# Patient Record
Sex: Male | Born: 1955 | Race: White | Hispanic: No | Marital: Married | Smoking: Never smoker
Health system: Southern US, Community
[De-identification: ages and names within clinical notes are randomized; demographics above are authoritative.]

## PROBLEM LIST (undated history)

## (undated) DIAGNOSIS — N4340 Spermatocele of epididymis, unspecified: Secondary | ICD-10-CM

## (undated) DIAGNOSIS — Z8719 Personal history of other diseases of the digestive system: Secondary | ICD-10-CM

## (undated) DIAGNOSIS — K219 Gastro-esophageal reflux disease without esophagitis: Secondary | ICD-10-CM

## (undated) DIAGNOSIS — R972 Elevated prostate specific antigen [PSA]: Secondary | ICD-10-CM

## (undated) DIAGNOSIS — J302 Other seasonal allergic rhinitis: Secondary | ICD-10-CM

## (undated) DIAGNOSIS — K649 Unspecified hemorrhoids: Secondary | ICD-10-CM

## (undated) DIAGNOSIS — M1990 Unspecified osteoarthritis, unspecified site: Secondary | ICD-10-CM

## (undated) HISTORY — DX: Other seasonal allergic rhinitis: J30.2

## (undated) HISTORY — DX: Unspecified osteoarthritis, unspecified site: M19.90

## (undated) HISTORY — PX: TONSILLECTOMY: SUR1361

---

## 2012-01-02 HISTORY — PX: COLONOSCOPY WITH PROPOFOL: SHX5780

## 2012-04-10 ENCOUNTER — Encounter: Payer: Self-pay | Admitting: Internal Medicine

## 2012-04-10 ENCOUNTER — Ambulatory Visit (INDEPENDENT_AMBULATORY_CARE_PROVIDER_SITE_OTHER): Payer: No Typology Code available for payment source | Admitting: Internal Medicine

## 2012-04-10 VITALS — BP 126/84 | HR 64 | Temp 98.2°F | Ht 74.0 in | Wt 202.0 lb

## 2012-04-10 DIAGNOSIS — Z0000 Encounter for general adult medical examination without abnormal findings: Secondary | ICD-10-CM | POA: Insufficient documentation

## 2012-04-10 DIAGNOSIS — R5381 Other malaise: Secondary | ICD-10-CM

## 2012-04-10 DIAGNOSIS — Z1211 Encounter for screening for malignant neoplasm of colon: Secondary | ICD-10-CM

## 2012-04-10 DIAGNOSIS — R5383 Other fatigue: Secondary | ICD-10-CM | POA: Insufficient documentation

## 2012-04-10 DIAGNOSIS — Z125 Encounter for screening for malignant neoplasm of prostate: Secondary | ICD-10-CM

## 2012-04-10 DIAGNOSIS — H9313 Tinnitus, bilateral: Secondary | ICD-10-CM

## 2012-04-10 DIAGNOSIS — H9319 Tinnitus, unspecified ear: Secondary | ICD-10-CM

## 2012-04-10 DIAGNOSIS — M549 Dorsalgia, unspecified: Secondary | ICD-10-CM | POA: Insufficient documentation

## 2012-04-10 DIAGNOSIS — Z13220 Encounter for screening for lipoid disorders: Secondary | ICD-10-CM

## 2012-04-10 NOTE — Patient Instructions (Addendum)
Return for fasting labs .  Decide who's doing your prostate exam.   Colonoscopy to be set u  Protect your hearing when you use the blower.  If the tinnitus does not improve in 3 weeks., call for referral to Audiology  Try the atkins bars for 3 hr snacks to prevent hypoglycemia    This is  One version of a  "Low GI"  Diet:  It will still lower your blood sugars and allow you to lose 4 to 8  lbs  per month if you follow it carefully.  Your goal with exercise is a minimum of 30 minutes of aerobic exercise 5 days per week (Walking does not count once it becomes easy!)    All of the foods can be found at grocery stores and in bulk at [employer].  The Atkins protein bars and shakes are available in more varieties at Target, WalMart and [employer] Foods.     7 AM Breakfast:  Choose from the following:  Low carbohydrate Protein  Shakes (I recommend the EAS AdvantEdge "Carb Control" shakes  Or the low carb shakes by Atkins.    2.5 carbs   Arnold's "Sandwhich Thin"toasted  w/ peanut butter (no jelly: about 20 net carbs  "Bagel Thin" with cream cheese and salmon: about 20 carbs   a scrambled egg/bacon/cheese burrito made with Mission's "carb balance" whole wheat tortilla  (about 10 net carbs )   Avoid cereal and bananas, oatmeal and cream of wheat and grits. They are loaded with carbohydrates!   10 AM: high protein snack  Protein bar by Atkins (the snack size, under 200 cal, usually < 6 net carbs).    A stick of cheese:  Around 1 carb,  100 cal     Dannon Light n Fit [place] Yogurt  (80 cal, 8 carbs)  Other so called "protein bars" and Greek yogurts tend to be loaded with carbohydrates.  Remember, in food advertising, the word "energy" is synonymous for " carbohydrate."  Lunch:   A Sandwich using the bread choices listed, Can use any  Eggs,  lunchmeat, grilled meat or canned tuna), avocado, regular mayo/mustard  and cheese.  A Salad using blue cheese, ranch,  Goddess or vinagrette,  No croutons or  "confetti" and no "candied nuts" but regular nuts OK.   No pretzels or chips.  Pickles and miniature sweet peppers are a good low carb alternative that provide a "crunch"  The bread is the only source of carbohydrate in a sandwich and  can be decreased by trying some of these alternatives to traditional loaf bread  Joseph's makes a pita bread and a flat bread that are 50 cal and 4 net carbs available at BJs and WalMart.  This can be toasted to use with hummous as well  Toufayan makes a low carb flatbread that's 100 cal and 9 net carbs available at [employer] and [employer] makes 2 sizes of  Low carb whole wheat tortilla  (The large one is 210 cal and 6 net carbs) Avoid "Low fat dressings, as well as Reyne Dumas and [address] dressings They are loaded with sugar!   3 PM/ Mid day  Snack:  Consider  1 ounce of  almonds, walnuts, pistachios, pecans, peanuts,  Macadamia nuts or a nut medley.  Avoid "granola"; the dried cranberries and raisins are loaded with carbohydrates. Mixed nuts as long as there are no raisins,  cranberries or dried fruit.     6 PM  Dinner:     Meat/fowl/fish with a green salad, and either broccoli, cauliflower, green beans, spinach, brussel sprouts or  Lima beans. DO NOT BREAD THE PROTEIN!!      There is a low carb pasta by Dreamfield's that is acceptable and tastes great: only 5 digestible carbs/serving.( All grocery stores but BJs carry it )  Try Kai Levins Angelo's chicken piccata or chicken or eggplant parm over low carb pasta.(Lowes and BJs)   Clifton Custard Sanchez's "Carnitas" (pulled pork, no sauce,  0 carbs) or his beef pot roast to make a dinner burrito (at BJ's)  Pesto over low carb pasta (bj's sells a good quality pesto in the center refrigerated section of the deli   Whole wheat pasta is still full of digestible carbs and  Not as low in glycemic index as Dreamfield's.   Brown rice is still rice,  So skip the rice and noodles if you eat [place] or [place] (or at least limit  to 1/2 cup)  9 PM snack :   Breyer's "low carb" fudgsicle or  ice cream bar (Carb Smart line), or  Weight Watcher's ice cream bar , or another "no sugar added" ice cream;  a serving of fresh berries/cherries with whipped cream   Cheese or DANNON'S LlGHT N FIT GREEK YOGURT  Avoid bananas, pineapple, grapes  and watermelon on a regular basis because they are high in sugar.  THINK OF THEM AS DESSERT  Remember that snack Substitutions should be less than 10 NET carbs per serving and meals < 20 carbs. Remember to subtract fiber grams to get the "net carbs."

## 2012-04-10 NOTE — Assessment & Plan Note (Signed)
Screened for OSA.  Aggravated by long work hours without eating.  Discussed eating habits . Screening for diabetes, thyroid and anemia.

## 2012-04-10 NOTE — Progress Notes (Signed)
Patient ID: Samuel Barnett, male   DOB: 12-20-1955, 57 y.o.   MRN: [id number]     Patient Active Problem List  Diagnosis  . Tinnitus of both ears  . Routine general medical examination at a health care facility  . Back pain without radiation  . Fatigue    Subjective:  CC:   Chief Complaint  Patient presents with  . Establish Care    HPI:   Samuel Barnett is a 57 y.o. male who presents as a new patient to establish primary care .  He is a healthy middle aged man with minor complaints.  1) Recent episodes of abdominal distension and gas for a few days, resolved with zantac .   2) Occasional back pain, rarely radiates to left leg.  Aggravated by prolonged stooping during surgeries .  Managed with aspirin,  juice plus, glucosamine and fish oil. 3) Mild chronic constipation controlled with oatmeal and prunes ,  aggravated by travel. Overdue for screening colonoscopy. 4) Ringing in the ears for the last few weeks, thinks it may have occurred after a sledding incident when he had a whip lash injury.  There was no headache, loss of consciousness or visual changes. However, he also uses leaf blowers  Regularly without hearing protection. 5) Up to date on eye exams, had a scleral cyst removed by [facility]. Eyes ok, no corrective lenses needed.     History reviewed. No pertinent past medical history.  Past Surgical History  Procedure Laterality Date  . Tonsilectomy, adenoidectomy, bilateral myringotomy and tubes      As a child    Family History  Problem Relation Age of Onset  . Cancer Mother   . Alcohol abuse Mother   . Cancer Maternal Aunt     unknown etiology  . Cancer Maternal Grandmother     pancreatic     History   Social History  . Marital Status: Married    Spouse Name: N/A    Number of Children: N/A  . Years of Education: N/A   Occupational History  . Not on file.   Social History Main Topics  . Smoking status: Never Smoker   . Smokeless tobacco: Never  Used  . Alcohol Use: 1.8 oz/week    2 Glasses of wine, 1 Cans of beer per week  . Drug Use: No  . Sexually Active: Not on file   Other Topics Concern  . Not on file   Social History Narrative  . No narrative on file   No Known Allergies   Review of Systems:   Patient denies headache, fevers, malaise, unintentional weight loss, skin rash, eye pain, sinus congestion and sinus pain, sore throat, dysphagia,  hemoptysis , cough, dyspnea, wheezing, chest pain, palpitations, orthopnea, edema, abdominal pain, nausea, melena, diarrhea, constipation, flank pain, dysuria, hematuria, urinary  Frequency, nocturia, numbness, tingling, seizures,  Focal weakness, Loss of consciousness,  Tremor, insomnia, depression, anxiety, and suicidal ideation.    Objective:  BP 126/84  Pulse 64  Temp(Src) 98.2 F (36.8 C) (Oral)  Ht 6\' 2"  (1.88 m)  Wt 202 lb (91.627 kg)  BMI 25.92 kg/m2  SpO2 97%  General appearance: alert, cooperative and appears stated age Ears: normal TM's and external ear canals both ears Throat: lips, mucosa, and tongue normal; teeth and gums normal Neck: no adenopathy, no carotid bruit, supple, symmetrical, trachea midline and thyroid not enlarged, symmetric, no tenderness/mass/nodules Back: symmetric, no curvature. ROM normal. No CVA tenderness. Lungs: clear to auscultation bilaterally Heart: regular  rate and rhythm, S1, S2 normal, no murmur, click, rub or gallop Abdomen: soft, non-tender; bowel sounds normal; no masses,  no organomegaly Pulses: 2+ and symmetric Skin: Skin color, texture, turgor normal. No rashes or lesions Lymph nodes: Cervical, supraclavicular, and axillary nodes normal.  Assessment and Plan:  Tinnitus of both ears New onset ,  Etiology likely from use of leaf blower.  Recent whiplash injury occurred without concussion.  If symptoms do not resolve in a few weeks, referral to Audiology  Routine general medical examination at a health care facility Annual  exam was done excluding testicular and prostate exam. PSA is pending .  Return for fasting lipids. Colon ca screening was commended, referral made     Back pain without radiation Episodic , aggravated by stooping.  Exam and DTRS are normal.  No workup at this time.   Fatigue Screened for OSA.  Aggravated by long work hours without eating.  Discussed eating habits . Screening for diabetes, thyroid and anemia.    Updated Medication List Outpatient Encounter Prescriptions as of 04/10/2012  Medication Sig Dispense Refill  . aspirin 81 MG tablet Take 81 mg by mouth daily.       No facility-administered encounter medications on file as of 04/10/2012.     Orders Placed This Encounter  Procedures  . PSA  . Lipid panel  . CBC with Differential  . Comprehensive metabolic panel  . TSH  . Ambulatory referral to Gastroenterology    No Follow-up on file.

## 2012-04-10 NOTE — Assessment & Plan Note (Signed)
New onset ,  Etiology likely from use of leaf blower.  Recent whiplash injury occurred without concussion.  If symptoms do not resolve in a few weeks, referral to Audiology

## 2012-04-10 NOTE — Assessment & Plan Note (Signed)
Episodic , aggravated by stooping.  Exam and DTRS are normal.  No workup at this time.

## 2012-04-10 NOTE — Assessment & Plan Note (Signed)
Annual exam was done excluding testicular and prostate exam. PSA is pending .  Return for fasting lipids. Colon ca screening was commended, referral made

## 2012-04-17 ENCOUNTER — Other Ambulatory Visit: Payer: No Typology Code available for payment source

## 2012-05-08 ENCOUNTER — Encounter: Payer: Self-pay | Admitting: *Deleted

## 2012-05-08 ENCOUNTER — Other Ambulatory Visit (INDEPENDENT_AMBULATORY_CARE_PROVIDER_SITE_OTHER): Payer: No Typology Code available for payment source

## 2012-05-08 DIAGNOSIS — Z125 Encounter for screening for malignant neoplasm of prostate: Secondary | ICD-10-CM

## 2012-05-08 DIAGNOSIS — R5381 Other malaise: Secondary | ICD-10-CM

## 2012-05-08 DIAGNOSIS — Z13220 Encounter for screening for lipoid disorders: Secondary | ICD-10-CM

## 2012-05-08 LAB — COMPREHENSIVE METABOLIC PANEL
ALT: 18 U/L (ref 0–53)
AST: 15 U/L (ref 0–37)
Calcium: 9 mg/dL (ref 8.4–10.5)
Chloride: 105 mEq/L (ref 96–112)
Creatinine, Ser: 1 mg/dL (ref 0.4–1.5)
Sodium: 138 mEq/L (ref 135–145)
Total Bilirubin: 0.8 mg/dL (ref 0.3–1.2)
Total Protein: 6.2 g/dL (ref 6.0–8.3)

## 2012-05-08 LAB — LIPID PANEL
Cholesterol: 196 mg/dL (ref 0–200)
HDL: 61.8 mg/dL (ref >39.00)
LDL Cholesterol: 124 mg/dL — ABNORMAL HIGH (ref 0–99)
Total CHOL/HDL Ratio: 3
Triglycerides: 53 mg/dL (ref 0.0–149.0)
VLDL: 10.6 mg/dL (ref 0.0–40.0)

## 2012-05-08 LAB — CBC WITH DIFFERENTIAL/PLATELET
Basophils Absolute: 0 10*3/uL (ref 0.0–0.1)
Eosinophils Absolute: 0.1 10*3/uL (ref 0.0–0.7)
Hemoglobin: 14.4 g/dL (ref 13.0–17.0)
Lymphocytes Relative: 51.1 % — ABNORMAL HIGH (ref 12.0–46.0)
MCHC: 34.9 g/dL (ref 30.0–36.0)
Monocytes Relative: 8.6 % (ref 3.0–12.0)
Neutro Abs: 2.1 10*3/uL (ref 1.4–7.7)
Platelets: 233 10*3/uL (ref 150.0–400.0)
RDW: 13.1 % (ref 11.5–14.6)

## 2012-05-08 LAB — TSH: TSH: 1.32 u[IU]/mL (ref 0.35–5.50)

## 2012-06-03 ENCOUNTER — Encounter: Payer: Self-pay | Admitting: Internal Medicine

## 2012-06-13 ENCOUNTER — Encounter: Payer: Self-pay | Admitting: Emergency Medicine

## 2012-08-04 ENCOUNTER — Ambulatory Visit (AMBULATORY_SURGERY_CENTER): Payer: No Typology Code available for payment source | Admitting: *Deleted

## 2012-08-04 VITALS — Ht 74.0 in | Wt 199.8 lb

## 2012-08-04 DIAGNOSIS — Z1211 Encounter for screening for malignant neoplasm of colon: Secondary | ICD-10-CM

## 2012-08-04 MED ORDER — NA SULFATE-K SULFATE-MG SULF 17.5-3.13-1.6 GM/177ML PO SOLN: ORAL | Status: DC

## 2012-08-14 ENCOUNTER — Ambulatory Visit (AMBULATORY_SURGERY_CENTER): Payer: No Typology Code available for payment source | Admitting: Internal Medicine

## 2012-08-14 ENCOUNTER — Encounter: Payer: Self-pay | Admitting: Internal Medicine

## 2012-08-14 VITALS — BP 120/81 | HR 63 | Temp 97.4°F | Resp 16 | Ht 74.0 in | Wt 199.0 lb

## 2012-08-14 DIAGNOSIS — Z1211 Encounter for screening for malignant neoplasm of colon: Secondary | ICD-10-CM

## 2012-08-14 DIAGNOSIS — K648 Other hemorrhoids: Secondary | ICD-10-CM

## 2012-08-14 MED ORDER — SODIUM CHLORIDE 0.9 % IV SOLN: 500.0000 mL | INTRAVENOUS | Status: DC

## 2012-08-14 NOTE — Progress Notes (Signed)
A/ox3 pleased with MAC, report to Karen RN 

## 2012-08-14 NOTE — Op Note (Signed)
[facility] [address] [place], [address]   COLONOSCOPY PROCEDURE REPORT  PATIENT: Samuel Barnett, Samuel Barnett  MR#: [id number] BIRTHDATE: [birth year] , 57  yrs. old GENDER: Male ENDOSCOPIST: Iva Boop, MD, [facility] REFERRED XB:JYNWGN Darrick Huntsman, M.D. PROCEDURE DATE:  08/14/2012 PROCEDURE:   Colonoscopy, screening First Screening Colonoscopy - Avg.  risk and is 50 yrs.  old or older Yes.  Prior Negative Screening - Now for repeat screening. N/A  History of Adenoma - Now for follow-up colonoscopy & has been > or = to 3 yrs.  N/A  Polyps Removed Today? No.  Recommend repeat exam, <10 yrs? No. ASA CLASS:   Class I INDICATIONS:average risk screening. MEDICATIONS: propofol (Diprivan) 300mg  IV, MAC sedation, administered by CRNA, and These medications were titrated to patient response per physician's verbal order  DESCRIPTION OF PROCEDURE:   After the risks benefits and alternatives of the procedure were thoroughly explained, informed consent was obtained.  A digital rectal exam revealed no abnormalities of the rectum, A digital rectal exam revealed no prostatic nodules, and A digital rectal exam revealed the prostate was not enlarged.   The LB CF-H180AL Loaner [id number]  endoscope was introduced through the anus and advanced to the cecum, which was identified by both the appendix and ileocecal valve. No adverse events experienced.   The quality of the prep was excellent using Suprep  The instrument was then slowly withdrawn as the colon was fully examined.      COLON FINDINGS: A normal appearing cecum, ileocecal valve, and appendiceal orifice were identified.  The ascending, hepatic flexure, transverse, splenic flexure, descending, sigmoid colon and rectum appeared unremarkable.  No polyps or cancers were seen.   A right colon retroflexion was performed.  Retroflexed views revealed internal hemorrhoids. The time to cecum=4 minutes 44 seconds. Withdrawal time=12 minutes 12  seconds.  The scope was withdrawn and the procedure completed. COMPLICATIONS: There were no complications.  ENDOSCOPIC IMPRESSION: 1.   Normal colon - excellent prep - first colonoscopy 2.   Internal hemorrhoids  RECOMMENDATIONS: Repeat Colonscopy in 10 years for routine screening   eSigned:  Iva Boop, MD, [facility] 08/14/2012 9:59 AM   cc: Duncan Dull, MD and The Patient

## 2012-08-14 NOTE — Patient Instructions (Addendum)
I found some hemorrhoids but everything else looked normal today.  If you have hemorrhoid problems (swelling, itching, bleeding) I am able to treat those with an in-office procedure. If you like, please call my office at [phone] to schedule an appointment and I can evaluate you further.  Next routine colonoscopy in 10 years 2024.  I appreciate the opportunity to care for you. Iva Boop, MD, FACG  YOU HAD AN ENDOSCOPIC PROCEDURE TODAY AT THE [facility]: Refer to the procedure report that was given to you for any specific questions about what was found during the examination.  If the procedure report does not answer your questions, please call your gastroenterologist to clarify.  If you requested that your care partner not be given the details of your procedure findings, then the procedure report has been included in a sealed envelope for you to review at your convenience later.  YOU SHOULD EXPECT: Some feelings of bloating in the abdomen. Passage of more gas than usual.  Walking can help get rid of the air that was put into your GI tract during the procedure and reduce the bloating. If you had a lower endoscopy (such as a colonoscopy or flexible sigmoidoscopy) you may notice spotting of blood in your stool or on the toilet paper. If you underwent a bowel prep for your procedure, then you may not have a normal bowel movement for a few days.  DIET: Your first meal following the procedure should be a light meal and then it is ok to progress to your normal diet.  A half-sandwich or bowl of soup is an example of a good first meal.  Heavy or fried foods are harder to digest and may make you feel nauseous or bloated.  Likewise meals heavy in dairy and vegetables can cause extra gas to form and this can also increase the bloating.  Drink plenty of fluids but you should avoid alcoholic beverages for 24 hours.  ACTIVITY: Your care partner should take you home directly after the procedure.   You should plan to take it easy, moving slowly for the rest of the day.  You can resume normal activity the day after the procedure however you should NOT DRIVE or use heavy machinery for 24 hours (because of the sedation medicines used during the test).    SYMPTOMS TO REPORT IMMEDIATELY: A gastroenterologist can be reached at any hour.  During normal business hours, 8:30 AM to 5:00 PM Monday through Friday, call [phone].  After hours and on weekends, please call the GI answering service at [phone] who will take a message and have the physician on call contact you.   Following lower endoscopy (colonoscopy or flexible sigmoidoscopy):  Excessive amounts of blood in the stool  Significant tenderness or worsening of abdominal pains  Swelling of the abdomen that is new, acute  Fever of 100F or higher  FOLLOW UP: If any biopsies were taken you will be contacted by phone or by letter within the next 1-3 weeks.  Call your gastroenterologist if you have not heard about the biopsies in 3 weeks.  Our staff will call the home number listed on your records the next business day following your procedure to check on you and address any questions or concerns that you may have at that time regarding the information given to you following your procedure. This is a courtesy call and so if there is no answer at the home number and we have not heard from  you through the emergency physician on call, we will assume that you have returned to your regular daily activities without incident.  SIGNATURES/CONFIDENTIALITY: You and/or your care partner have signed paperwork which will be entered into your electronic medical record.  These signatures attest to the fact that that the information above on your After Visit Summary has been reviewed and is understood.  Full responsibility of the confidentiality of this discharge information lies with you and/or your care-partner.

## 2012-08-14 NOTE — Progress Notes (Signed)
Patient did not experience any of the following events: a burn prior to discharge; a fall within the facility; wrong site/side/patient/procedure/implant event; or a hospital transfer or hospital admission upon discharge from the facility. (G8907) Patient did not have preoperative order for IV antibiotic SSI prophylaxis. (G8918)  

## 2012-08-15 ENCOUNTER — Telehealth: Payer: Self-pay

## 2012-08-15 NOTE — Telephone Encounter (Signed)
  Follow up Call-  Call back number 08/14/2012  Post procedure Call Back phone  # [phone]  Permission to leave phone message Yes     Patient questions:  Do you have a fever, pain , or abdominal swelling? no Pain Score  0 *  Have you tolerated food without any problems? yes  Have you been able to return to your normal activities? yes  Do you have any questions about your discharge instructions: Diet   no Medications  no Follow up visit  no  Do you have questions or concerns about your Care? no  Actions: * If pain score is 4 or above: No action needed, pain <4.

## 2012-08-18 LAB — HM COLONOSCOPY: HM Colonoscopy: NORMAL

## 2012-08-30 ENCOUNTER — Other Ambulatory Visit: Payer: Self-pay | Admitting: Internal Medicine

## 2012-09-02 ENCOUNTER — Telehealth: Payer: Self-pay | Admitting: Internal Medicine

## 2012-09-02 NOTE — Telephone Encounter (Signed)
Omeprazole #90 day supply needed.  [facility] [address]

## 2012-09-03 ENCOUNTER — Other Ambulatory Visit: Payer: Self-pay | Admitting: *Deleted

## 2012-09-03 MED ORDER — OMEPRAZOLE 40 MG PO CPDR: 40.0000 mg | DELAYED_RELEASE_CAPSULE | Freq: Every day | ORAL | Status: DC

## 2012-09-03 NOTE — Telephone Encounter (Signed)
rx sent to wrong pharmacy,  Then [facility] to [facility]

## 2012-09-03 NOTE — Telephone Encounter (Signed)
Omeprazole not on med list...ok to send?

## 2013-11-05 ENCOUNTER — Encounter (HOSPITAL_BASED_OUTPATIENT_CLINIC_OR_DEPARTMENT_OTHER): Payer: Self-pay | Admitting: *Deleted

## 2013-11-06 ENCOUNTER — Other Ambulatory Visit: Payer: Self-pay | Admitting: Urology

## 2013-11-06 ENCOUNTER — Encounter (HOSPITAL_BASED_OUTPATIENT_CLINIC_OR_DEPARTMENT_OTHER): Payer: Self-pay | Admitting: *Deleted

## 2013-11-06 NOTE — H&P (Signed)
History of Present Illness   Samuel Barnett presents today as a self referral with at least 12-18 months of some progressive right-sided scrotal swelling. He has not had any urologic history. He has had no scrotal or inguinal history. He is not aware of any trauma, infections, or other processes within the scrotal compartment. Again, this has progressively enlarged. It has not been particularly painful but the size does become irritative to him depending on positions and movements. He feels that he might have a hydrocele. He is a Magazine features editor. He has really no significant voiding complaints. He has had prostate exams in the past and apparently normal PSA's but not within the past 12 months.     Past Medical History Problems  1. History of No acute medical problems  Surgical History Problems  1. History of Complete Colonoscopy 2. History of Tonsillectomy  Current Meds 1. Aspirin 81 MG Oral Tablet;  Therapy: (Recorded:27Mar2015) to Recorded 2. Fish Oil CAPS;  Therapy: (Recorded:27Mar2015) to Recorded 3. Vitamin D TABS;  Therapy: (Recorded:27Mar2015) to Recorded  Allergies Medication  1. No Known Drug Allergies  Family History Problems  1. Family history of cardiac disorder (V17.49) : Father 2. Family history of gout (V18.19) : Father 3. Family history of lung cancer (V16.1) : Mother 4. Family history of Hematuria : Father  Social History Problems    Alcohol use (V49.89)   1   Caffeine use (V49.89)   Father alive and healthy   44   Married   Mother deceased   59 Lung cancer   Never smoker   Number of children   1 son / 2 daughters   Occupation   Animal nutritionist  Review of Systems Genitourinary, constitutional, skin, eye, otolaryngeal, hematologic/lymphatic, cardiovascular, pulmonary, endocrine, musculoskeletal, gastrointestinal, neurological and psychiatric system(s) were reviewed and pertinent findings if present are noted.    Vitals Vital Signs [Data  Includes: Last 1 Day]  Recorded: 27Mar2015 11:36AM  Height: 6 ft 2 in Weight: 198 lb  BMI Calculated: 25.42 BSA Calculated: 2.16 Blood Pressure: 128 / 80 Heart Rate: 66 Respiration: 18  Physical Exam ENT:. The ears and nose are normal in appearance.  Neck: The appearance of the neck is normal and no neck mass is present.  Pulmonary: No respiratory distress and normal respiratory rhythm and effort.  Cardiovascular: Heart rate and rhythm are normal . No peripheral edema.  Abdomen: The abdomen is soft and nontender. No masses are palpated. No CVA tenderness. No hernias are palpable. No hepatosplenomegaly noted.  Rectal: Rectal exam demonstrates normal sphincter tone, no tenderness and no masses. Estimated prostate size is 1+. Normal rectal tone, no rectal masses, prostate is smooth, symmetric and non-tender. The prostate has no nodularity and is not tender. The left seminal vesicle is nonpalpable. The right seminal vesicle is nonpalpable. The perineum is normal on inspection.  Genitourinary: Examination of the penis demonstrates no discharge, no masses, no lesions and a normal meatus. The scrotum is without lesions. The right epididymis is found to have a 7-8 cm spermatocele, but palpably normal and non-tender. The left epididymis is palpably normal and non-tender. The right testis is palpably normal, non-tender and without masses. The left testis is normal, non-tender and without masses.  Skin: Normal skin turgor, no visible rash and no visible skin lesions.  Neuro/Psych:. Mood and affect are appropriate.    Results/Data COLOR YELLOW  APPEARANCE CLEAR  SPECIFIC GRAVITY 1.015  pH 7.5  GLUCOSE NEG mg/dL BILIRUBIN NEG  KETONE NEG mg/dL BLOOD NEG  PROTEIN NEG mg/dL UROBILINOGEN 0.2 mg/dL NITRITE NEG  LEUKOCYTE ESTERASE NEG    Scrotal ultrasonography was performed. Full measurements were recorded on the worksheet. Both testes were symmetric with very normal homogeneous echo patterns.  Normal blood flow. Emanating off the head of the epididymis on the right was a very large unilocular cyst measuring approximately 7 cm x 9 cm. In the head of the left epididymis were multiple small sub 1 cm cysts. There was no evidence of hydrocele.    Assessment Assessed  1. 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 Prostate cancer screening (V76.44)    Discussion/Summary   Large moderately symptomatic right spermatocele. This has increased progressively over the last 12-18 months and is likely to continue to increase in size. At this point, Samuel Barnett is interested in surgical correction, which given the size and some of his symptoms is certainly quite reasonable. We would anticipate outpatient surgery with scrotal exploration and excision of right spermatocele. A portion of the right epididymis may be required to be removed as well. We would certainly not address the left-sided small spermatoceles. We talked about postoperative recovery, the nature of the surgery, etc. He is interested in proceeding, but we will need to plan based on his work and will call us back at a later date. He is interested in having PSA screening and we will draw PSA on him today.

## 2013-11-06 NOTE — Progress Notes (Signed)
NPO AFTER MN.  ARRIVE AT 0600.  NEEDS HG.  

## 2013-11-09 ENCOUNTER — Ambulatory Visit (HOSPITAL_BASED_OUTPATIENT_CLINIC_OR_DEPARTMENT_OTHER): Payer: No Typology Code available for payment source | Admitting: Anesthesiology

## 2013-11-09 ENCOUNTER — Encounter (HOSPITAL_BASED_OUTPATIENT_CLINIC_OR_DEPARTMENT_OTHER)
Admission: RE | Disposition: A | Discharge status: Home / Self Care | Payer: Self-pay | Source: Ambulatory Visit | Attending: Urology

## 2013-11-09 ENCOUNTER — Ambulatory Visit (HOSPITAL_BASED_OUTPATIENT_CLINIC_OR_DEPARTMENT_OTHER)
Admission: RE | Admit: 2013-11-09 | Discharge: 2013-11-09 | Disposition: A | Discharge status: Home / Self Care | Payer: No Typology Code available for payment source | Source: Ambulatory Visit | Attending: Urology | Admitting: Urology

## 2013-11-09 ENCOUNTER — Encounter (HOSPITAL_BASED_OUTPATIENT_CLINIC_OR_DEPARTMENT_OTHER): Payer: Self-pay | Admitting: *Deleted

## 2013-11-09 DIAGNOSIS — Z7982 Long term (current) use of aspirin: Secondary | ICD-10-CM | POA: Diagnosis not present

## 2013-11-09 DIAGNOSIS — N508 Other specified disorders of male genital organs: Secondary | ICD-10-CM | POA: Diagnosis present

## 2013-11-09 DIAGNOSIS — N503 Cyst of epididymis: Secondary | ICD-10-CM | POA: Insufficient documentation

## 2013-11-09 HISTORY — DX: Gastro-esophageal reflux disease without esophagitis: K21.9

## 2013-11-09 HISTORY — PX: SPERMATOCELECTOMY: SHX2420

## 2013-11-09 HISTORY — DX: Personal history of other diseases of the digestive system: Z87.19

## 2013-11-09 HISTORY — DX: Spermatocele of epididymis, unspecified: N43.40

## 2013-11-09 HISTORY — PX: SCROTAL EXPLORATION: SHX2386

## 2013-11-09 LAB — POCT HEMOGLOBIN-HEMACUE: Hemoglobin: 14.4 g/dL (ref 13.0–17.0)

## 2013-11-09 SURGERY — EXPLORATION, SCROTUM
Anesthesia: General | Site: Scrotum | Laterality: Right

## 2013-11-09 MED ORDER — OXYCODONE HCL 5 MG PO TABS
5.0000 mg | ORAL_TABLET | Freq: Once | ORAL | Status: AC | PRN
  Administered 2013-11-09: 5 mg via ORAL
  Filled 2013-11-09: qty 1

## 2013-11-09 MED ORDER — PROPOFOL 10 MG/ML IV BOLUS
INTRAVENOUS | Status: DC | PRN
  Administered 2013-11-09: 200 mg via INTRAVENOUS

## 2013-11-09 MED ORDER — OXYCODONE HCL 5 MG/5ML PO SOLN
5.0000 mg | Freq: Once | ORAL | Status: AC | PRN
  Filled 2013-11-09: qty 5

## 2013-11-09 MED ORDER — OXYCODONE HCL 5 MG PO TABS
ORAL_TABLET | ORAL | Status: AC
  Filled 2013-11-09: qty 1

## 2013-11-09 MED ORDER — FENTANYL CITRATE 0.05 MG/ML IJ SOLN
INTRAMUSCULAR | Status: DC | PRN
  Administered 2013-11-09: 25 ug via INTRAVENOUS
  Administered 2013-11-09: 50 ug via INTRAVENOUS
  Administered 2013-11-09: 25 ug via INTRAVENOUS

## 2013-11-09 MED ORDER — DEXAMETHASONE SODIUM PHOSPHATE 4 MG/ML IJ SOLN
INTRAMUSCULAR | Status: DC | PRN
  Administered 2013-11-09: 10 mg via INTRAVENOUS

## 2013-11-09 MED ORDER — KETOROLAC TROMETHAMINE 30 MG/ML IJ SOLN
INTRAMUSCULAR | Status: DC | PRN
  Administered 2013-11-09: 30 mg via INTRAVENOUS

## 2013-11-09 MED ORDER — MIDAZOLAM HCL 5 MG/5ML IJ SOLN
INTRAMUSCULAR | Status: DC | PRN
  Administered 2013-11-09: 2 mg via INTRAVENOUS

## 2013-11-09 MED ORDER — MIDAZOLAM HCL 2 MG/2ML IJ SOLN
INTRAMUSCULAR | Status: AC
  Filled 2013-11-09: qty 2

## 2013-11-09 MED ORDER — SODIUM CHLORIDE 0.9 % IR SOLN
Status: DC | PRN
  Administered 2013-11-09: 500 mL

## 2013-11-09 MED ORDER — HYDROMORPHONE HCL 1 MG/ML IJ SOLN
0.2500 mg | INTRAMUSCULAR | Status: DC | PRN
  Filled 2013-11-09: qty 1

## 2013-11-09 MED ORDER — PROMETHAZINE HCL 25 MG/ML IJ SOLN
6.2500 mg | INTRAMUSCULAR | Status: DC | PRN
  Filled 2013-11-09: qty 1

## 2013-11-09 MED ORDER — CEFAZOLIN SODIUM-DEXTROSE 2-3 GM-% IV SOLR
INTRAVENOUS | Status: DC | PRN
  Administered 2013-11-09: 2 g via INTRAVENOUS

## 2013-11-09 MED ORDER — ACETAMINOPHEN 10 MG/ML IV SOLN
INTRAVENOUS | Status: DC | PRN
  Administered 2013-11-09: 1000 mg via INTRAVENOUS

## 2013-11-09 MED ORDER — LACTATED RINGERS IV SOLN
INTRAVENOUS | Status: DC
  Administered 2013-11-09 (×2): via INTRAVENOUS
  Filled 2013-11-09: qty 1000

## 2013-11-09 MED ORDER — BUPIVACAINE HCL (PF) 0.25 % IJ SOLN
INTRAMUSCULAR | Status: DC | PRN
  Administered 2013-11-09: 10 mL

## 2013-11-09 MED ORDER — MEPERIDINE HCL 25 MG/ML IJ SOLN
6.2500 mg | INTRAMUSCULAR | Status: DC | PRN
  Filled 2013-11-09: qty 1

## 2013-11-09 MED ORDER — CEFAZOLIN SODIUM-DEXTROSE 2-3 GM-% IV SOLR
INTRAVENOUS | Status: AC
  Filled 2013-11-09: qty 50

## 2013-11-09 MED ORDER — GLYCOPYRROLATE 0.2 MG/ML IJ SOLN
INTRAMUSCULAR | Status: DC | PRN
  Administered 2013-11-09: 0.2 mg via INTRAVENOUS

## 2013-11-09 MED ORDER — LIDOCAINE HCL (CARDIAC) 20 MG/ML IV SOLN
INTRAVENOUS | Status: DC | PRN
  Administered 2013-11-09: 100 mg via INTRAVENOUS

## 2013-11-09 MED ORDER — ONDANSETRON HCL 4 MG/2ML IJ SOLN
INTRAMUSCULAR | Status: DC | PRN
  Administered 2013-11-09: 4 mg via INTRAVENOUS

## 2013-11-09 MED ORDER — OXYCODONE-ACETAMINOPHEN 5-325 MG PO TABS: 1.0000 | ORAL_TABLET | ORAL | Status: DC | PRN

## 2013-11-09 MED ORDER — FENTANYL CITRATE 0.05 MG/ML IJ SOLN
INTRAMUSCULAR | Status: AC
  Filled 2013-11-09: qty 6

## 2013-11-09 SURGICAL SUPPLY — 52 items
APPLICATOR COTTON TIP 6IN STRL (MISCELLANEOUS) ×3 IMPLANT
BENZOIN TINCTURE PRP APPL 2/3 (GAUZE/BANDAGES/DRESSINGS) ×3 IMPLANT
BLADE CLIPPER SURG (BLADE) ×3 IMPLANT
BLADE SURG 10 STRL SS (BLADE) IMPLANT
BLADE SURG 15 STRL LF DISP TIS (BLADE) ×1 IMPLANT
BLADE SURG 15 STRL SS (BLADE) ×2
BNDG GAUZE ELAST 4 BULKY (GAUZE/BANDAGES/DRESSINGS) ×3 IMPLANT
CANISTER SUCTION 1200CC (MISCELLANEOUS) IMPLANT
CANISTER SUCTION 2500CC (MISCELLANEOUS) IMPLANT
CLEANER CAUTERY TIP 5X5 PAD (MISCELLANEOUS) ×1 IMPLANT
CLOSURE WOUND 1/2 X4 (GAUZE/BANDAGES/DRESSINGS)
CLOTH BEACON ORANGE TIMEOUT ST (SAFETY) ×3 IMPLANT
COVER MAYO STAND STRL (DRAPES) ×3 IMPLANT
COVER TABLE BACK 60X90 (DRAPES) ×3 IMPLANT
DISSECTOR ROUND CHERRY 3/8 STR (MISCELLANEOUS) IMPLANT
DRAIN PENROSE 18X1/4 LTX STRL (WOUND CARE) IMPLANT
DRAPE PED LAPAROTOMY (DRAPES) ×3 IMPLANT
DRSG TEGADERM 2-3/8X2-3/4 SM (GAUZE/BANDAGES/DRESSINGS) ×3 IMPLANT
DRSG TELFA 3X8 NADH (GAUZE/BANDAGES/DRESSINGS) ×3 IMPLANT
ELECT REM PT RETURN 9FT ADLT (ELECTROSURGICAL) ×3
ELECTRODE REM PT RTRN 9FT ADLT (ELECTROSURGICAL) ×1 IMPLANT
GLOVE BIO SURGEON STRL SZ7.5 (GLOVE) ×6 IMPLANT
GOWN PREVENTION PLUS LG XLONG (DISPOSABLE) IMPLANT
GOWN STRL REUS W/ TWL XL LVL3 (GOWN DISPOSABLE) ×2 IMPLANT
GOWN STRL REUS W/TWL XL LVL3 (GOWN DISPOSABLE) ×4
GOWN W/COTTON TOWEL STD LRG (GOWNS) IMPLANT
GOWN XL W/COTTON TOWEL STD (GOWNS) ×3 IMPLANT
LOOP VESSEL MAXI BLUE (MISCELLANEOUS) IMPLANT
NEEDLE HYPO 22GX1.5 SAFETY (NEEDLE) ×3 IMPLANT
NS IRRIG 500ML POUR BTL (IV SOLUTION) ×3 IMPLANT
PACK BASIN DAY SURGERY FS (CUSTOM PROCEDURE TRAY) ×3 IMPLANT
PAD CLEANER CAUTERY TIP 5X5 (MISCELLANEOUS) ×2
PENCIL BUTTON HOLSTER BLD 10FT (ELECTRODE) ×3 IMPLANT
STRIP CLOSURE SKIN 1/2X4 (GAUZE/BANDAGES/DRESSINGS) IMPLANT
SUPPORT SCROTAL LG STRP (MISCELLANEOUS) ×2 IMPLANT
SUPPORTER ATHLETIC LG (MISCELLANEOUS) ×1
SUT SILK 0 SH 30 (SUTURE) IMPLANT
SUT SILK 0 TIES 10X30 (SUTURE) IMPLANT
SUT VIC AB 2-0 CT1 27 (SUTURE) ×2
SUT VIC AB 2-0 CT1 TAPERPNT 27 (SUTURE) ×1 IMPLANT
SUT VIC AB 3-0 CT1 36 (SUTURE) IMPLANT
SUT VIC AB 3-0 SH 27 (SUTURE) ×2
SUT VIC AB 3-0 SH 27X BRD (SUTURE) ×1 IMPLANT
SUT VICRYL 0 TIES 12 18 (SUTURE) ×3 IMPLANT
SUT VICRYL 4-0 PS2 18IN ABS (SUTURE) ×3 IMPLANT
SYR BULB IRRIGATION 50ML (SYRINGE) IMPLANT
SYR CONTROL 10ML LL (SYRINGE) ×3 IMPLANT
TRAY DSU PREP LF (CUSTOM PROCEDURE TRAY) ×3 IMPLANT
TUBE CONNECTING 12'X1/4 (SUCTIONS) ×1
TUBE CONNECTING 12X1/4 (SUCTIONS) ×2 IMPLANT
WATER STERILE IRR 500ML POUR (IV SOLUTION) IMPLANT
YANKAUER SUCT BULB TIP NO VENT (SUCTIONS) ×3 IMPLANT

## 2013-11-09 NOTE — Op Note (Signed)
Preoperative diagnosis:  1. Right epididymal head cyst  Postoperative diagnosis: 1. Same  Procedure(s): 1. Excision of right epididymal head cyst  Surgeon: Rana Snare, MD  Resident: Langley Adie, MD  Anesthesia: General   Complications: None apparent  EBL: Minimal  Specimens: None  Intraoperative findings: Right epididymal head cyst  Indication: 58 yo with symptomatic right epididymal head cyst  Description of procedure:  Patient was induced with general anesthesia in the supine position.  Scrotum was clipped and prepped and draped in the usual sterile fashion.  Peri-operative antibiotics were provided.  Final timeout called and information confirmed correct.  Began by making a midline scrotal incision along the raphe.  The right testicle with large epididymal head cyst was delivered using a combination of sharp and electrocautery dissection.  The cord was dissected off of the cyst and we were successful in preserving the cord structures.   The cyst was dissected down to its attachment to the epididymis and then completely dissected free.  The testicle appeared normal and viable.  There was a small hydrocele sac of tunica that was dissected free as well.  Hemostasis was confirmed and testicle returned to scrotum in the normal anatomic position.    Dartos was closed with running 3-0 vicryl and skin closed with running 4-0.  Dressing with tefla and tegaderm applied.  Patient awoken and taken to PACU for recovery.

## 2013-11-09 NOTE — Anesthesia Postprocedure Evaluation (Signed)
Anesthesia Post Note  Patient: Estate manager/land agent  Procedure(s) Performed: Procedure(s) (LRB): SCROTUM EXPLORATION (Right) SPERMATOCELECTOMY (Right)  Anesthesia type: General  Patient location: PACU  Post pain: Pain level controlled  Post assessment: Post-op Vital signs reviewed  Last Vitals: BP 137/74 mmHg  Pulse 58  Temp(Src) 36.6 C (Oral)  Resp 14  Ht 6\' 2"  (1.88 m)  Wt 198 lb (89.812 kg)  BMI 25.41 kg/m2  SpO2 100%  Post vital signs: Reviewed  Level of consciousness: sedated  Complications: No apparent anesthesia complications

## 2013-11-09 NOTE — Anesthesia Preprocedure Evaluation (Signed)
Anesthesia Evaluation  Patient identified by MRN, date of birth, ID band Patient awake    Reviewed: Allergy & Precautions, H&P , NPO status , Patient's Chart, lab work & pertinent test results  Airway Mallampati: II  TM Distance: >3 FB Neck ROM: Full    Dental no notable dental hx.    Pulmonary neg pulmonary ROS,  breath sounds clear to auscultation  Pulmonary exam normal       Cardiovascular negative cardio ROS  Rhythm:Regular Rate:Normal     Neuro/Psych negative neurological ROS  negative psych ROS   GI/Hepatic negative GI ROS, Neg liver ROS,   Endo/Other  negative endocrine ROS  Renal/GU negative Renal ROS     Musculoskeletal negative musculoskeletal ROS (+)   Abdominal   Peds  Hematology negative hematology ROS (+)   Anesthesia Other Findings   Reproductive/Obstetrics negative OB ROS                             Anesthesia Physical Anesthesia Plan  ASA: I  Anesthesia Plan: General   Post-op Pain Management:    Induction: Intravenous  Airway Management Planned: LMA  Additional Equipment: None  Intra-op Plan:   Post-operative Plan: Extubation in OR  Informed Consent: I have reviewed the patients History and Physical, chart, labs and discussed the procedure including the risks, benefits and alternatives for the proposed anesthesia with the patient or authorized representative who has indicated his/her understanding and acceptance.   Dental advisory given  Plan Discussed with: CRNA  Anesthesia Plan Comments:         Anesthesia Quick Evaluation

## 2013-11-09 NOTE — Interval H&P Note (Signed)
History and Physical Interval Note:  11/09/2013 7:34 AM  Samuel Barnett  has presented today for surgery, with the diagnosis of Right Spermatocele  The various methods of treatment have been discussed with the patient and family. After consideration of risks, benefits and other options for treatment, the patient has consented to  Procedure(s): SCROTUM EXPLORATION (Right) SPERMATOCELECTOMY (Right) as a surgical intervention .  The patient's history has been reviewed, patient examined, no change in status, stable for surgery.  I have reviewed the patient's chart and labs.  Questions were answered to the patient's satisfaction.     Mehlani Blankenburg S

## 2013-11-09 NOTE — Transfer of Care (Signed)
Immediate Anesthesia Transfer of Care Note  Patient: Samuel Barnett  Procedure(s) Performed: Procedure(s) (LRB): SCROTUM EXPLORATION (Right) SPERMATOCELECTOMY (Right)  Patient Location: PACU  Anesthesia Type: General  Level of Consciousness: awake, alert  and oriented  Airway & Oxygen Therapy: Patient Spontanous Breathing and Patient connected to face mask oxygen  Post-op Assessment: Report given to PACU RN and Post -op Vital signs reviewed and stable  Post vital signs: Reviewed and stable  Complications: No apparent anesthesia complications

## 2013-11-09 NOTE — Anesthesia Procedure Notes (Signed)
Procedure Name: LMA Insertion Date/Time: 11/09/2013 7:45 AM Performed by: Mechele Claude Pre-anesthesia Checklist: Patient identified, Emergency Drugs available, Suction available and Patient being monitored Patient Re-evaluated:Patient Re-evaluated prior to inductionOxygen Delivery Method: Circle System Utilized Preoxygenation: Pre-oxygenation with 100% oxygen Intubation Type: IV induction Ventilation: Mask ventilation without difficulty LMA: LMA inserted LMA Size: 5.0 Number of attempts: 1 Airway Equipment and Method: bite block Placement Confirmation: positive ETCO2 Tube secured with: Tape Dental Injury: Teeth and Oropharynx as per pre-operative assessment

## 2013-11-09 NOTE — Discharge Instructions (Addendum)
°  Post Anesthesia Home Care Instructions ° °Activity: °Get plenty of rest for the remainder of the day. A responsible adult should stay with you for 24 hours following the procedure.  °For the next 24 hours, DO NOT: °-Drive a car °-Operate machinery °-Drink alcoholic beverages °-Take any medication unless instructed by your physician °-Make any legal decisions or sign important papers. ° °Meals: °Start with liquid foods such as gelatin or soup. Progress to regular foods as tolerated. Avoid greasy, spicy, heavy foods. If nausea and/or vomiting occur, drink only clear liquids until the nausea and/or vomiting subsides. Call your physician if vomiting continues. ° °Special Instructions/Symptoms: °Your throat may feel dry or sore from the anesthesia or the breathing tube placed in your throat during surgery. If this causes discomfort, gargle with warm salt water. The discomfort should disappear within 24 hours. ° ° ° HOME CARE INSTRUCTIONS FOR SCROTAL PROCEDURES ° °Wound Care & Hygiene: °You may apply an ice bag to the scrotum for the first 24 hours.  This may help decrease swelling and soreness.  You may have a dressing held in place by an athletic supporter.  You may remove the dressing in 24 hours and shower in 48 hours.  Continue to use the athletic supporter or tight briefs for at least a week. °Activity: °Rest today - not necessarily flat bed rest.  Just take it easy.  You should not do strenuous activities until your follow-up visit with your doctor.  You may resume light activity in 48 hours. ° °Return to Work: ° °Your doctor will advise you of this depending on the type of work you do ° °Diet: °Drink liquids or eat a light diet this evening.  You may resume a regular diet tomorrow. ° °General Expectations: °You may have a small amount of bleeding.  The scrotum may be swollen or bruised for about a week. ° °Call your Doctor if these occur: ° -persistent or heavy bleeding ° -temperature of 101 degrees or  more ° -severe pain, not relieved by your pain medication ° °Return to Doctor's Office:  °Call to set up and appointment. ° °Patient Signature:  __________________________________________________ ° °Nurse's Signature:  __________________________________________________ ° °

## 2013-11-10 ENCOUNTER — Encounter (HOSPITAL_BASED_OUTPATIENT_CLINIC_OR_DEPARTMENT_OTHER): Payer: Self-pay | Admitting: Urology

## 2014-11-18 DIAGNOSIS — D239 Other benign neoplasm of skin, unspecified: Secondary | ICD-10-CM

## 2014-11-18 HISTORY — DX: Other benign neoplasm of skin, unspecified: D23.9

## 2018-10-02 ENCOUNTER — Other Ambulatory Visit: Payer: Self-pay | Admitting: Urology

## 2018-10-02 ENCOUNTER — Other Ambulatory Visit: Payer: Self-pay | Admitting: *Deleted

## 2018-10-02 DIAGNOSIS — R972 Elevated prostate specific antigen [PSA]: Secondary | ICD-10-CM

## 2018-10-14 ENCOUNTER — Ambulatory Visit
Admission: RE | Admit: 2018-10-14 | Discharge: 2018-10-14 | Disposition: A | Discharge status: Home / Self Care | Payer: Self-pay | Source: Ambulatory Visit | Attending: Urology | Admitting: Urology

## 2018-10-14 ENCOUNTER — Other Ambulatory Visit: Payer: Self-pay

## 2018-10-14 DIAGNOSIS — R972 Elevated prostate specific antigen [PSA]: Secondary | ICD-10-CM | POA: Insufficient documentation

## 2018-10-14 IMAGING — MR MR PROSTATE WO/W CM
56 series · 56 of 56 positions shown · IV contrast (9ml Gadavist)
Comparison: None.

CLINICAL DATA: Elevated PSA of 8.6.  No prior biopsy or surgery.

EXAM:
MR PROSTATE WITHOUT AND WITH CONTRAST
TECHNIQUE: Multiplanar multisequence MRI images were obtained of the pelvis
centered about the prostate. Pre and post contrast images were
obtained.
CONTRAST:  9mL GADAVIST GADOBUTROL 1 MMOL/ML IV SOLN

[Series 3: T1 · axial · 8.0mm · 0.74mm/px · 1 of 25 slices shown (1 of 46)]
[im 1/25]
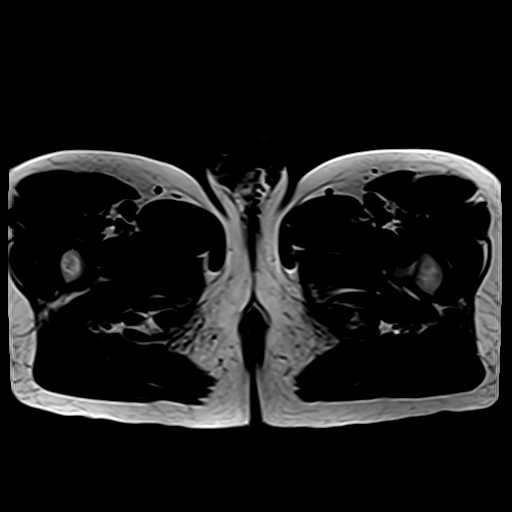

[Series 4: bSSFP fat-sat · axial · 8.0mm · 0.74mm/px · 1 of 25 slices shown]
[im 1/25]
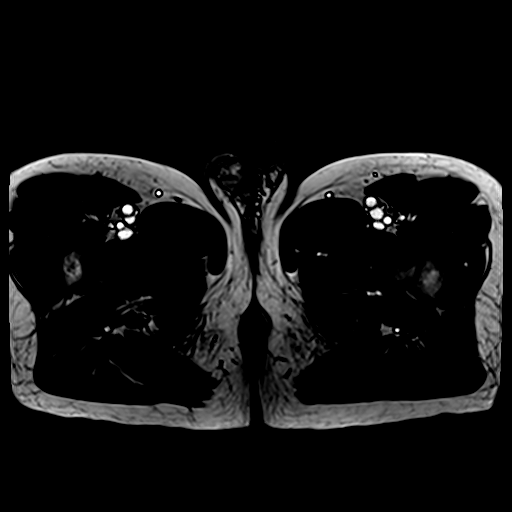

[Series 5: T2 · coronal · 3.0mm · 0.70mm/px · 1 of 30 slices shown (1 of 3)]
[im 1/30]
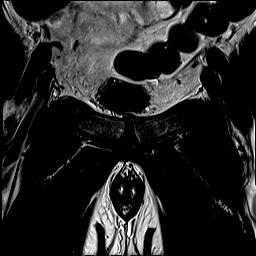

[Series 6: T2 · sagittal · 3.5mm · 0.62mm/px · 1 of 35 slices shown (2 of 3)]
[im 1/35]
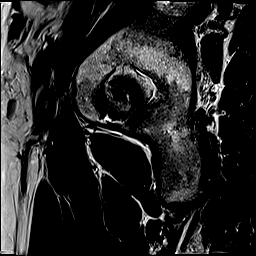

[Series 7: T1 · axial · 3.0mm · 0.35mm/px · 1 of 30 slices shown (2 of 46)]
[im 1/30]
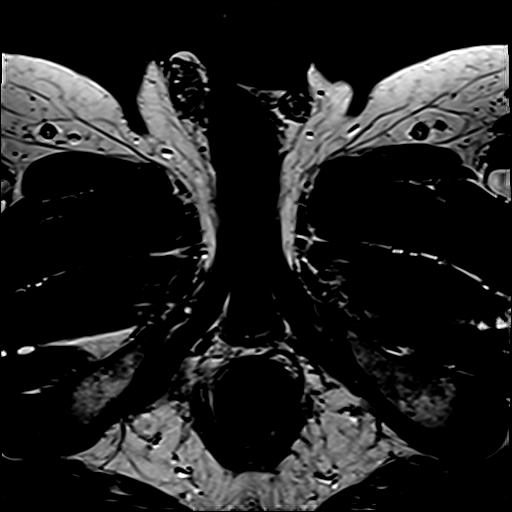

[Series 8: T2 · axial · 3.5mm · 0.56mm/px · 1 of 23 slices shown (3 of 3)]
[im 1/23]
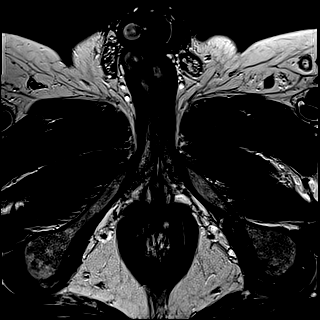

[Series 9: t2_space_tra (id) · axial · 1.0mm · 1.04mm/px · 1 of 80 slices shown]
[im 1/80]
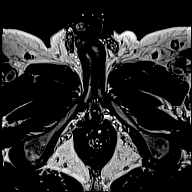

[Series 10: ax dwi_tracew · axial · 3.0mm · 0.78mm/px · 1 of 25 slices shown (1 of 3)]
[im 1/25]
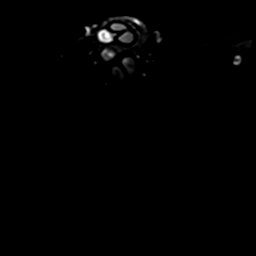

[Series 10: ax dwi_tracew · axial · 3.0mm · 0.78mm/px · 1 of 25 slices shown (2 of 3)]
[im 1/25]
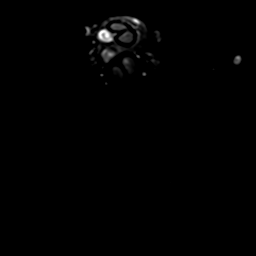

[Series 10: ax dwi_tracew · axial · 3.0mm · 0.78mm/px · 1 of 25 slices shown (3 of 3)]
[im 1/25]
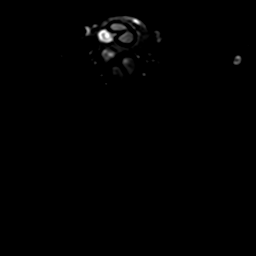

[Series 11: ax dwi_adc · axial · 3.0mm · 0.78mm/px · 1 of 25 slices shown]
[im 1/25]
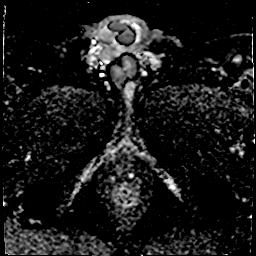

[Series 12: ax dwi_calc_bval · axial · 3.0mm · 0.78mm/px · 1 of 25 slices shown]
[im 1/25]
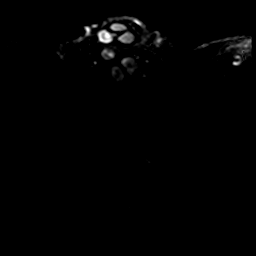

[Series 13: T1 · axial · 3.0mm · 1.15mm/px · 1 of 28 slices shown (3 of 46)]
[im 1/28]
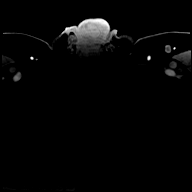

[Series 14: T1 · axial · 3.0mm · 1.15mm/px · 1 of 28 slices shown (4 of 46)]
[im 1/28]
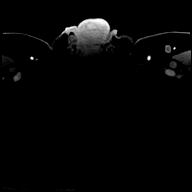

[Series 15: T1 · axial · 3.0mm · 1.15mm/px · 1 of 28 slices shown (5 of 46)]
[im 1/28]
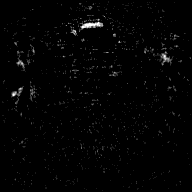

[Series 16: T1 · axial · 3.0mm · 1.15mm/px · 1 of 28 slices shown (6 of 46)]
[im 1/28]
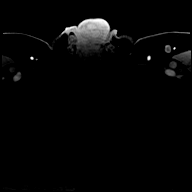

[Series 17: T1 · axial · 3.0mm · 1.15mm/px · 1 of 28 slices shown (7 of 46)]
[im 1/28]
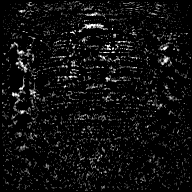

[Series 18: T1 · axial · 3.0mm · 1.15mm/px · 1 of 28 slices shown (8 of 46)]
[im 1/28]
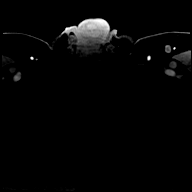

[Series 19: T1 · axial · 3.0mm · 1.15mm/px · 1 of 28 slices shown (9 of 46)]
[im 1/28]
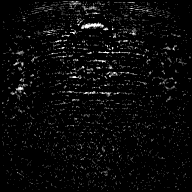

[Series 20: T1 · axial · 3.0mm · 1.15mm/px · 1 of 28 slices shown (10 of 46)]
[im 1/28]
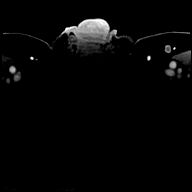

[Series 21: T1 · axial · 3.0mm · 1.15mm/px · 1 of 28 slices shown (11 of 46)]
[im 1/28]
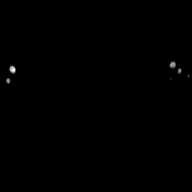

[Series 22: T1 · axial · 3.0mm · 1.15mm/px · 1 of 28 slices shown (12 of 46)]
[im 1/28]
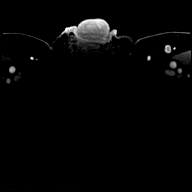

[Series 23: T1 · axial · 3.0mm · 1.15mm/px · 1 of 28 slices shown (13 of 46)]
[im 1/28]
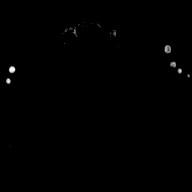

[Series 24: T1 · axial · 3.0mm · 1.15mm/px · 1 of 28 slices shown (14 of 46)]
[im 1/28]
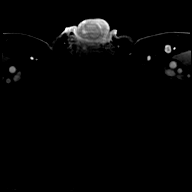

[Series 25: T1 · axial · 3.0mm · 1.15mm/px · 1 of 28 slices shown (15 of 46)]
[im 1/28]
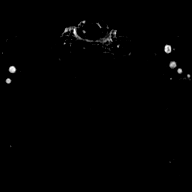

[Series 26: T1 · axial · 3.0mm · 1.15mm/px · 1 of 28 slices shown (16 of 46)]
[im 1/28]
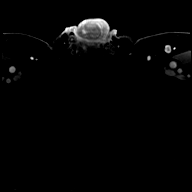

[Series 27: T1 · axial · 3.0mm · 1.15mm/px · 1 of 28 slices shown (17 of 46)]
[im 1/28]
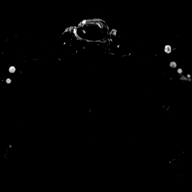

[Series 28: T1 · axial · 3.0mm · 1.15mm/px · 1 of 28 slices shown (18 of 46)]
[im 1/28]
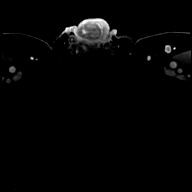

[Series 29: T1 · axial · 3.0mm · 1.15mm/px · 1 of 28 slices shown (19 of 46)]
[im 1/28]
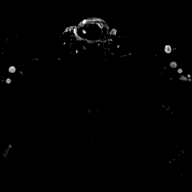

[Series 30: T1 · axial · 3.0mm · 1.15mm/px · 1 of 28 slices shown (20 of 46)]
[im 1/28]
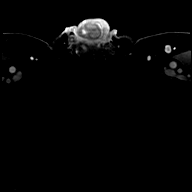

[Series 31: T1 · axial · 3.0mm · 1.15mm/px · 1 of 28 slices shown (21 of 46)]
[im 1/28]
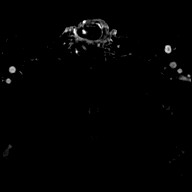

[Series 32: T1 · axial · 3.0mm · 1.15mm/px · 1 of 28 slices shown (22 of 46)]
[im 1/28]
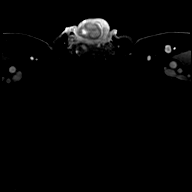

[Series 33: T1 · axial · 3.0mm · 1.15mm/px · 1 of 28 slices shown (23 of 46)]
[im 1/28]
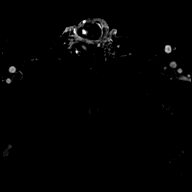

[Series 34: T1 · axial · 3.0mm · 1.15mm/px · 1 of 28 slices shown (24 of 46)]
[im 1/28]
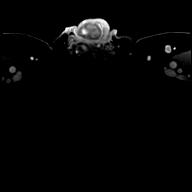

[Series 35: T1 · axial · 3.0mm · 1.15mm/px · 1 of 28 slices shown (25 of 46)]
[im 1/28]
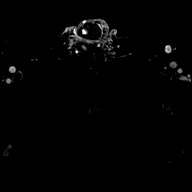

[Series 36: T1 · axial · 3.0mm · 1.15mm/px · 1 of 28 slices shown (26 of 46)]
[im 1/28]
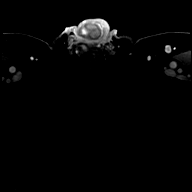

[Series 37: T1 · axial · 3.0mm · 1.15mm/px · 1 of 28 slices shown (27 of 46)]
[im 1/28]
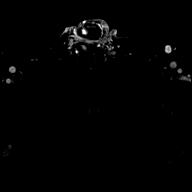

[Series 38: T1 · axial · 3.0mm · 1.15mm/px · 1 of 28 slices shown (28 of 46)]
[im 1/28]
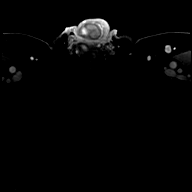

[Series 39: T1 · axial · 3.0mm · 1.15mm/px · 1 of 28 slices shown (29 of 46)]
[im 1/28]
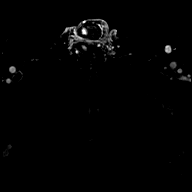

[Series 40: T1 · axial · 3.0mm · 1.15mm/px · 1 of 28 slices shown (30 of 46)]
[im 1/28]
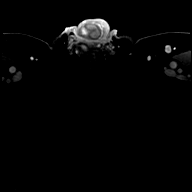

[Series 41: T1 · axial · 3.0mm · 1.15mm/px · 1 of 28 slices shown (31 of 46)]
[im 1/28]
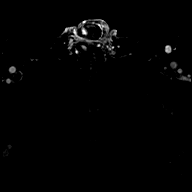

[Series 42: T1 · axial · 3.0mm · 1.15mm/px · 1 of 28 slices shown (32 of 46)]
[im 1/28]
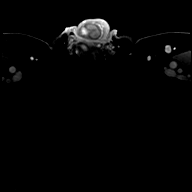

[Series 43: T1 · axial · 3.0mm · 1.15mm/px · 1 of 28 slices shown (33 of 46)]
[im 1/28]
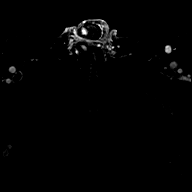

[Series 44: T1 · axial · 3.0mm · 1.15mm/px · 1 of 28 slices shown (34 of 46)]
[im 1/28]
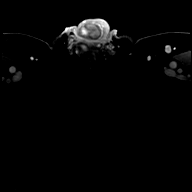

[Series 45: T1 · axial · 3.0mm · 1.15mm/px · 1 of 28 slices shown (35 of 46)]
[im 1/28]
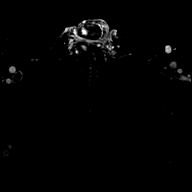

[Series 46: T1 · axial · 3.0mm · 1.15mm/px · 1 of 28 slices shown (36 of 46)]
[im 1/28]
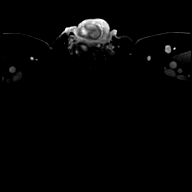

[Series 47: T1 · axial · 3.0mm · 1.15mm/px · 1 of 28 slices shown (37 of 46)]
[im 1/28]
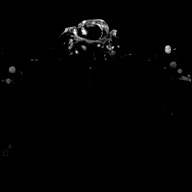

[Series 48: T1 · axial · 3.0mm · 1.15mm/px · 1 of 28 slices shown (38 of 46)]
[im 1/28]
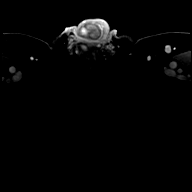

[Series 49: T1 · axial · 3.0mm · 1.15mm/px · 1 of 28 slices shown (39 of 46)]
[im 1/28]
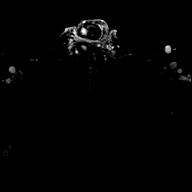

[Series 50: T1 · axial · 3.0mm · 1.15mm/px · 1 of 28 slices shown (40 of 46)]
[im 1/28]
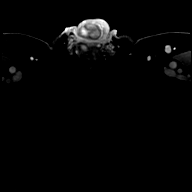

[Series 51: T1 · axial · 3.0mm · 1.15mm/px · 1 of 28 slices shown (41 of 46)]
[im 1/28]
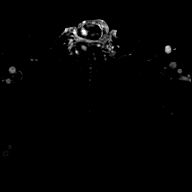

[Series 52: T1 · axial · 3.0mm · 1.15mm/px · 1 of 28 slices shown (42 of 46)]
[im 1/28]
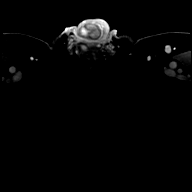

[Series 53: T1 · axial · 3.0mm · 1.15mm/px · 1 of 28 slices shown (43 of 46)]
[im 1/28]
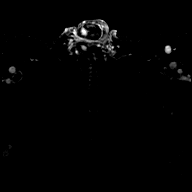

[Series 54: T1 · axial · 3.0mm · 1.15mm/px · 1 of 28 slices shown (44 of 46)]
[im 1/28]
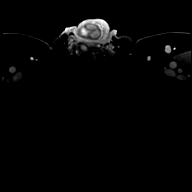

[Series 55: T1 · axial · 3.0mm · 1.15mm/px · 1 of 28 slices shown (45 of 46)]
[im 1/28]
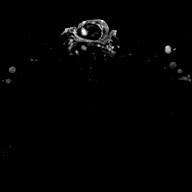

[Series 56: T1 · axial · 3.0mm · 1.15mm/px · 1 of 28 slices shown (46 of 46)]
[im 1/28]
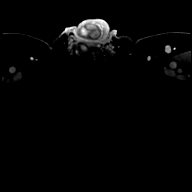

[56 of 56 positions shown; findings below may reference images not displayed]

FINDINGS: Prostate: Demonstrates mild to moderate central gland enlargement
and heterogeneity, consistent with benign prostatic hyperplasia. No
suspicious central gland nodule.

Mildly heterogeneous T2 signal throughout the peripheral zone,
especially in the mid to apical regions. No masslike areas of T2
hypointensity or restricted diffusion. Dynamic postcontrast imaging
demonstrates vague enhancement within the mid to apical gland
relatively diffusely throughout the peripheral zone. Example 18/25.

Volume: 5.7 x 3.9 x 4.1 cm (volume = 48 cm^3)

Transcapsular spread:  Absent

Seminal vesicle involvement: Absent

Neurovascular bundle involvement: Absent

Pelvic adenopathy: Absent

Bone metastasis: Absent

Other findings: No significant free fluid.  Normal urinary bladder.
IMPRESSION: 1. No findings of macroscopic or high-grade prostate carcinoma.
2. Mild to moderate benign prostatic hyperplasia.
3. Heterogeneous T2 signal within the mid to apical peripheral zone
diffusely with vague early post-contrast enhancement. Findings are
nonspecific but can be seen with prostatitis.

## 2018-10-14 MED ORDER — GADOBUTROL 1 MMOL/ML IV SOLN
9.0000 mL | Freq: Once | INTRAVENOUS | Status: AC | PRN
  Administered 2018-10-14: 9 mL via INTRAVENOUS

## 2020-04-05 DIAGNOSIS — C801 Malignant (primary) neoplasm, unspecified: Secondary | ICD-10-CM

## 2020-04-05 HISTORY — DX: Malignant (primary) neoplasm, unspecified: C80.1

## 2020-04-27 ENCOUNTER — Other Ambulatory Visit: Payer: Self-pay | Admitting: Urology

## 2020-04-27 DIAGNOSIS — R972 Elevated prostate specific antigen [PSA]: Secondary | ICD-10-CM

## 2020-05-09 ENCOUNTER — Other Ambulatory Visit: Payer: Self-pay

## 2020-05-19 ENCOUNTER — Ambulatory Visit: Payer: BC Managed Care – PPO

## 2020-06-02 ENCOUNTER — Ambulatory Visit
Admission: RE | Admit: 2020-06-02 | Discharge: 2020-06-02 | Disposition: A | Discharge status: Home / Self Care | Payer: BC Managed Care – PPO | Source: Ambulatory Visit | Attending: Urology | Admitting: Urology

## 2020-06-02 ENCOUNTER — Other Ambulatory Visit: Payer: Self-pay

## 2020-06-02 DIAGNOSIS — R972 Elevated prostate specific antigen [PSA]: Secondary | ICD-10-CM | POA: Insufficient documentation

## 2020-06-02 IMAGING — MR MR PROSTATE WO/W CM
56 series · 56 of 56 positions shown · IV contrast (9ml Gadavist)
Comparison: Prostate MRI [DATE]

CLINICAL DATA: Elevated PSA 65-year-old male.

EXAM:
MR PROSTATE WITHOUT AND WITH CONTRAST
TECHNIQUE: Multiplanar multisequence MRI images were obtained of the pelvis
centered about the prostate. Pre and post contrast images were
obtained.
CONTRAST:  9mL GADAVIST GADOBUTROL 1 MMOL/ML IV SOLN

[Series 3: ax in&out whole · axial · 5.0mm · 0.74mm/px · 1 of 70 slices shown]
[im 1/70]
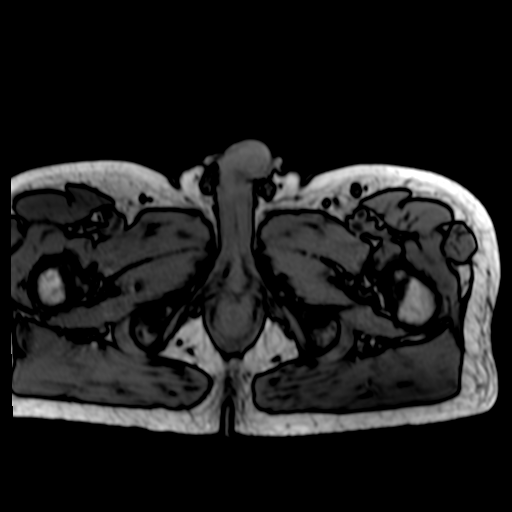

[Series 4: T2 · axial · 3.0mm · 0.56mm/px · 1 of 25 slices shown (1 of 3)]
[im 1/25]
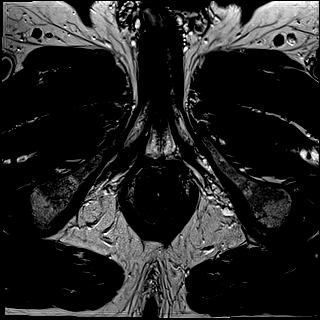

[Series 5: T2 · coronal · 3.0mm · 0.70mm/px · 1 of 35 slices shown (2 of 3)]
[im 1/35]
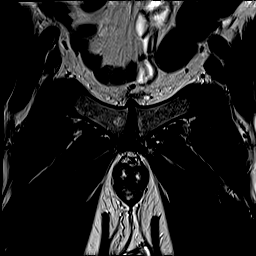

[Series 6: DWI · axial · 3.0mm · 0.86mm/px · 1 of 75 slices shown (1 of 3)]
[im 1/75]
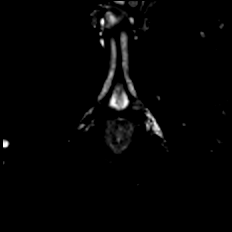

[Series 7: DWI · axial · 3.0mm · 0.86mm/px · 1 of 25 slices shown (2 of 3)]
[im 1/25]
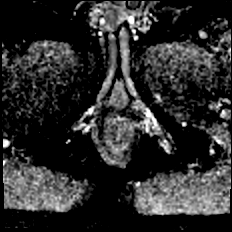

[Series 8: DWI · axial · 3.0mm · 0.86mm/px · 1 of 25 slices shown (3 of 3)]
[im 1/25]
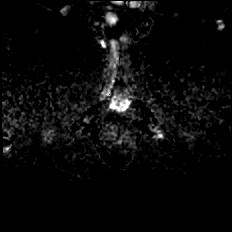

[Series 9: T2 · axial · 1.0mm · 1.04mm/px · 1 of 72 slices shown (3 of 3)]
[im 1/72]
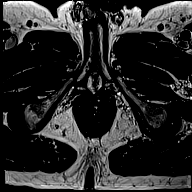

[Series 10: T1 · axial · 3.0mm · 1.15mm/px · 1 of 28 slices shown (1 of 49)]
[im 1/28]
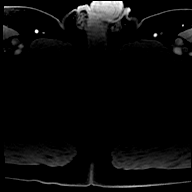

[Series 11: T1 · axial · 3.0mm · 1.15mm/px · 1 of 28 slices shown (2 of 49)]
[im 1/28]
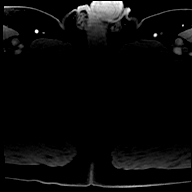

[Series 12: T1 · axial · 3.0mm · 1.15mm/px · 1 of 28 slices shown (3 of 49)]
[im 1/28]
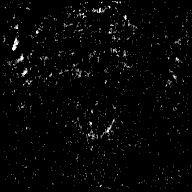

[Series 13: T1 · axial · 3.0mm · 1.15mm/px · 1 of 28 slices shown (4 of 49)]
[im 1/28]
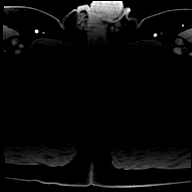

[Series 14: T1 · axial · 3.0mm · 1.15mm/px · 1 of 28 slices shown (5 of 49)]
[im 1/28]
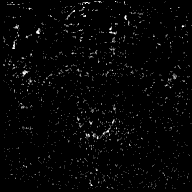

[Series 15: T1 · axial · 3.0mm · 1.15mm/px · 1 of 28 slices shown (6 of 49)]
[im 1/28]
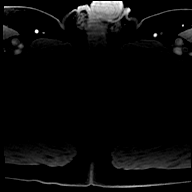

[Series 16: T1 · axial · 3.0mm · 1.15mm/px · 1 of 28 slices shown (7 of 49)]
[im 1/28]
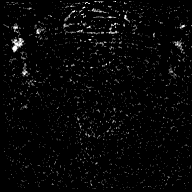

[Series 17: T1 · axial · 3.0mm · 1.15mm/px · 1 of 28 slices shown (8 of 49)]
[im 1/28]
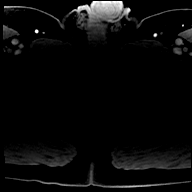

[Series 18: T1 · axial · 3.0mm · 1.15mm/px · 1 of 28 slices shown (9 of 49)]
[im 1/28]
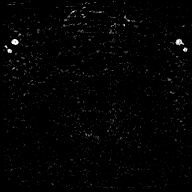

[Series 19: T1 · axial · 3.0mm · 1.15mm/px · 1 of 28 slices shown (10 of 49)]
[im 1/28]
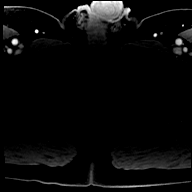

[Series 20: T1 · axial · 3.0mm · 1.15mm/px · 1 of 28 slices shown (11 of 49)]
[im 1/28]
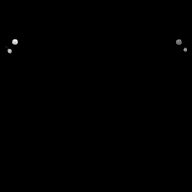

[Series 21: T1 · axial · 3.0mm · 1.15mm/px · 1 of 28 slices shown (12 of 49)]
[im 1/28]
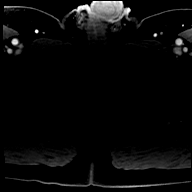

[Series 22: T1 · axial · 3.0mm · 1.15mm/px · 1 of 28 slices shown (13 of 49)]
[im 1/28]
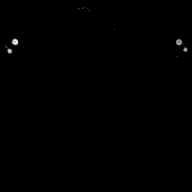

[Series 23: T1 · axial · 3.0mm · 1.15mm/px · 1 of 28 slices shown (14 of 49)]
[im 1/28]
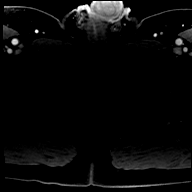

[Series 24: T1 · axial · 3.0mm · 1.15mm/px · 1 of 28 slices shown (15 of 49)]
[im 1/28]
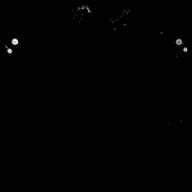

[Series 25: T1 · axial · 3.0mm · 1.15mm/px · 1 of 28 slices shown (16 of 49)]
[im 1/28]
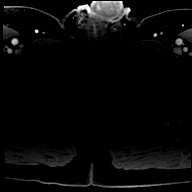

[Series 26: T1 · axial · 3.0mm · 1.15mm/px · 1 of 28 slices shown (17 of 49)]
[im 1/28]
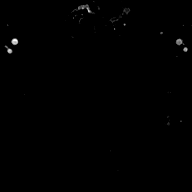

[Series 27: T1 · axial · 3.0mm · 1.15mm/px · 1 of 28 slices shown (18 of 49)]
[im 1/28]
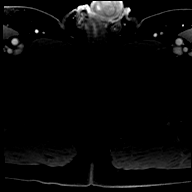

[Series 28: T1 · axial · 3.0mm · 1.15mm/px · 1 of 28 slices shown (19 of 49)]
[im 1/28]
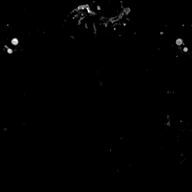

[Series 29: T1 · axial · 3.0mm · 1.15mm/px · 1 of 28 slices shown (20 of 49)]
[im 1/28]
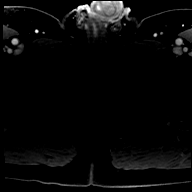

[Series 30: T1 · axial · 3.0mm · 1.15mm/px · 1 of 28 slices shown (21 of 49)]
[im 1/28]
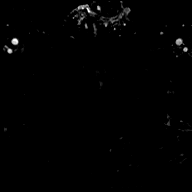

[Series 31: T1 · axial · 3.0mm · 1.15mm/px · 1 of 28 slices shown (22 of 49)]
[im 1/28]
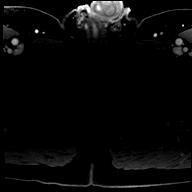

[Series 32: T1 · axial · 3.0mm · 1.15mm/px · 1 of 28 slices shown (23 of 49)]
[im 1/28]
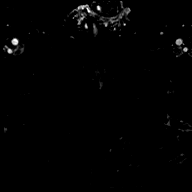

[Series 33: T1 · axial · 3.0mm · 1.15mm/px · 1 of 28 slices shown (24 of 49)]
[im 1/28]
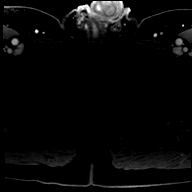

[Series 34: T1 · axial · 3.0mm · 1.15mm/px · 1 of 28 slices shown (25 of 49)]
[im 1/28]
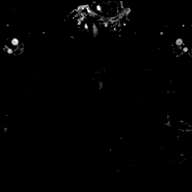

[Series 35: T1 · axial · 3.0mm · 1.15mm/px · 1 of 28 slices shown (26 of 49)]
[im 1/28]
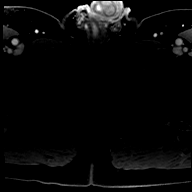

[Series 36: T1 · axial · 3.0mm · 1.15mm/px · 1 of 28 slices shown (27 of 49)]
[im 1/28]
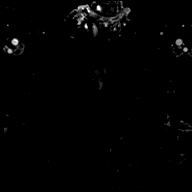

[Series 37: T1 · axial · 3.0mm · 1.15mm/px · 1 of 28 slices shown (28 of 49)]
[im 1/28]
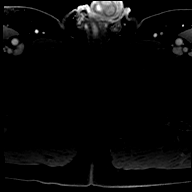

[Series 38: T1 · axial · 3.0mm · 1.15mm/px · 1 of 28 slices shown (29 of 49)]
[im 1/28]
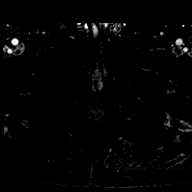

[Series 39: T1 · axial · 3.0mm · 1.15mm/px · 1 of 28 slices shown (30 of 49)]
[im 1/28]
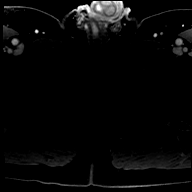

[Series 40: T1 · axial · 3.0mm · 1.15mm/px · 1 of 28 slices shown (31 of 49)]
[im 1/28]
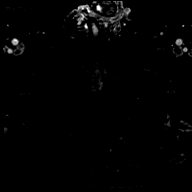

[Series 41: T1 · axial · 3.0mm · 1.15mm/px · 1 of 28 slices shown (32 of 49)]
[im 1/28]
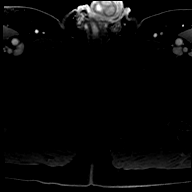

[Series 42: T1 · axial · 3.0mm · 1.15mm/px · 1 of 28 slices shown (33 of 49)]
[im 1/28]
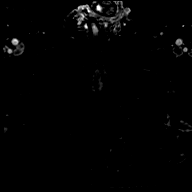

[Series 43: T1 · axial · 3.0mm · 1.15mm/px · 1 of 28 slices shown (34 of 49)]
[im 1/28]
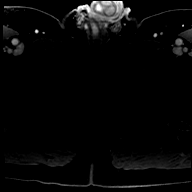

[Series 44: T1 · axial · 3.0mm · 1.15mm/px · 1 of 28 slices shown (35 of 49)]
[im 1/28]
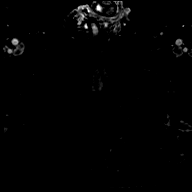

[Series 45: T1 · axial · 3.0mm · 1.15mm/px · 1 of 28 slices shown (36 of 49)]
[im 1/28]
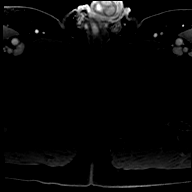

[Series 46: T1 · axial · 3.0mm · 1.15mm/px · 1 of 28 slices shown (37 of 49)]
[im 1/28]
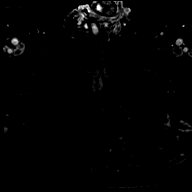

[Series 47: T1 · axial · 3.0mm · 1.15mm/px · 1 of 28 slices shown (38 of 49)]
[im 1/28]
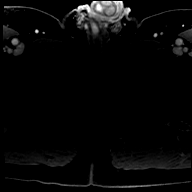

[Series 48: T1 · axial · 3.0mm · 1.15mm/px · 1 of 28 slices shown (39 of 49)]
[im 1/28]
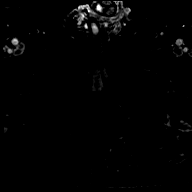

[Series 49: T1 · axial · 3.0mm · 1.15mm/px · 1 of 28 slices shown (40 of 49)]
[im 1/28]
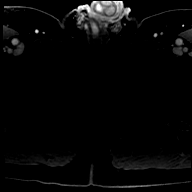

[Series 50: T1 · axial · 3.0mm · 1.15mm/px · 1 of 28 slices shown (41 of 49)]
[im 1/28]
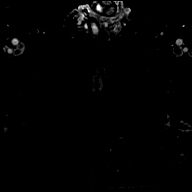

[Series 51: T1 · axial · 3.0mm · 1.15mm/px · 1 of 28 slices shown (42 of 49)]
[im 1/28]
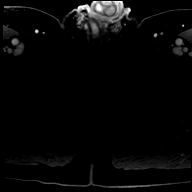

[Series 52: T1 · axial · 3.0mm · 1.15mm/px · 1 of 28 slices shown (43 of 49)]
[im 1/28]
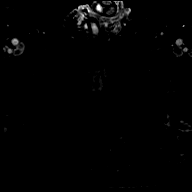

[Series 53: T1 · axial · 3.0mm · 1.15mm/px · 1 of 28 slices shown (44 of 49)]
[im 1/28]
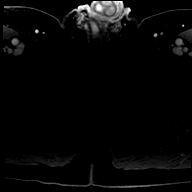

[Series 54: T1 · axial · 3.0mm · 1.15mm/px · 1 of 28 slices shown (45 of 49)]
[im 1/28]
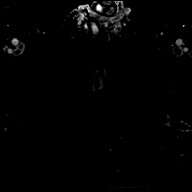

[Series 55: T1 · axial · 3.0mm · 1.15mm/px · 1 of 28 slices shown (46 of 49)]
[im 1/28]
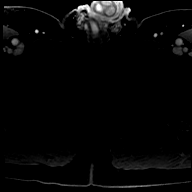

[Series 56: T1 · axial · 3.0mm · 1.15mm/px · 1 of 28 slices shown (47 of 49)]
[im 1/28]
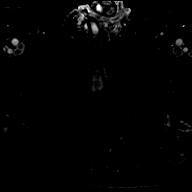

[Series 57: T1 · axial · 3.0mm · 1.15mm/px · 1 of 28 slices shown (48 of 49)]
[im 1/28]
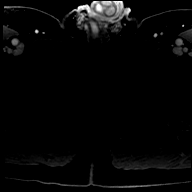

[Series 58: T1 · axial · 3.0mm · 1.15mm/px · 1 of 28 slices shown (49 of 49)]
[im 1/28]
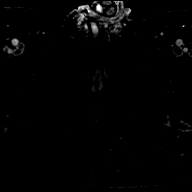

[56 of 56 positions shown; findings below may reference images not displayed]

FINDINGS: Prostate: Within the RIGHT peripheral zone, there is a focus
restricted diffusion in the RIGHT mid gland measuring 1.8 x 1.1 cm
on image 9/series 7. The diffusion restriction is high (series 8).
This lesion is accompanied by hypointensity on T2 weighted imaging
measuring 1.6 cm x 1.1 cm on image 7/series 4.

No additional foci of restricted diffusion in the peripheral zone.

The transitional zone is enlarged by well capsulated nodules without
suspicious imaging findings on T2 weighted imaging (series 4).

Prostatic capsule intact.  Seminal vesicles normal

Volume: 5.8 x 4.3 x 4.5 cm (volume = 59 cm^3)

Transcapsular spread:  Absent

Seminal vesicle involvement: Absent

Neurovascular bundle involvement: Absent

Pelvic adenopathy: Absent. Several signal voids in the pelvis not
changed from prior and favored metallic/calcific artifact. (Example
4 mm lesions on image [DATE] on T2 weighted imaging within the
perirectal fat.) Unchanged from unchanged [BC].

Bone metastasis: Absent

Other findings: None
IMPRESSION: 1. Lesion within the RIGHT mid gland peripheral zone is suspicious
for high-grade carcinoma. PI-RADS: 5 (lesion greater than 1.5 cm).
(Dynacad 3D post processing performed). LUCYLA #1.
2. Nodular transitional zone most consistent benign prostate
hypertrophy. PI-RADS: 2.

## 2020-06-02 MED ORDER — GADOBUTROL 1 MMOL/ML IV SOLN
9.0000 mL | Freq: Once | INTRAVENOUS | Status: AC | PRN
  Administered 2020-06-02: 9 mL via INTRAVENOUS

## 2020-06-30 ENCOUNTER — Other Ambulatory Visit: Payer: Self-pay

## 2020-06-30 ENCOUNTER — Encounter
Admission: RE | Admit: 2020-06-30 | Discharge: 2020-06-30 | Disposition: A | Discharge status: Home / Self Care | Payer: BC Managed Care – PPO | Source: Ambulatory Visit | Attending: Urology | Admitting: Urology

## 2020-06-30 HISTORY — DX: Elevated prostate specific antigen (PSA): R97.20

## 2020-06-30 HISTORY — DX: Unspecified hemorrhoids: K64.9

## 2020-06-30 NOTE — Patient Instructions (Signed)
Your procedure is scheduled on:07-07-20 Thursday Report to the Registration Desk on the 1st floor of the [facility]-Then proceed to the 2nd floor Surgery Desk in the [facility] To find out your arrival time, please call [phone] between 1PM - 3PM on:07-06-20 Wednesday  REMEMBER: Instructions that are not followed completely may result in serious medical risk, up to and including death; or upon the discretion of your surgeon and anesthesiologist your surgery may need to be rescheduled.  Do not eat food after midnight the night before surgery.  No gum chewing, lozengers or hard candies.  You may however, drink CLEAR liquids up to 2 hours before you are scheduled to arrive for your surgery. Do not drink anything within 2 hours of your scheduled arrival time.  Clear liquids include: - water  - apple juice without pulp - gatorade (not RED, PURPLE, OR BLUE) - black coffee or tea (Do NOT add milk or creamers to the coffee or tea) Do NOT drink anything that is not on this list.  Do not take any medication the day of surgery  One week prior to surgery: Stop Anti-inflammatories (NSAIDS) such as Advil, Aleve, Ibuprofen, Motrin, Naproxen, Naprosyn and Aspirin based products such as Excedrin, Goodys Powder, BC Powder.You may however, continue to take Tylenol if needed for pain up until the day of surgery.  Stop ANY OVER THE COUNTER supplements/vitamins NOW (06-30-20) until after surgery.  No Alcohol for 24 hours before or after surgery.  No Smoking including e-cigarettes for 24 hours prior to surgery.  No chewable tobacco products for at least 6 hours prior to surgery.  No nicotine patches on the day of surgery.  Do not use any "recreational" drugs for at least a week prior to your surgery.  Please be advised that the combination of cocaine and anesthesia may have negative outcomes, up to and including death. If you test positive for cocaine, your surgery will be cancelled.  On the  morning of surgery brush your teeth with toothpaste and water, you may rinse your mouth with mouthwash if you wish. Do not swallow any toothpaste or mouthwash.  Do not wear jewelry, make-up, hairpins, clips or nail polish.  Do not wear lotions, powders, or perfumes.   Do not shave body from the neck down 48 hours prior to surgery just in case you cut yourself which could leave a site for infection.  Also, freshly shaved skin may become irritated if using the CHG soap.  Contact lenses, hearing aids and dentures may not be worn into surgery.  Do not bring valuables to the hospital. [facility] is not responsible for any missing/lost belongings or valuables. .  Fleets enema as directed-Do fleet enema at home the morning of surgery 1 hour prior to arrival time to the hospital. If you are scheduled to arrive at the hospital @ 6am, you can do the enema the night before your procedure  Notify your doctor if there is any change in your medical condition (cold, fever, infection).  Wear comfortable clothing (specific to your surgery type) to the hospital.  After surgery, you can help prevent lung complications by doing breathing exercises.  Take deep breaths and cough every 1-2 hours. Your doctor may order a device called an Incentive Spirometer to help you take deep breaths. When coughing or sneezing, hold a pillow firmly against your incision with both hands. This is called "splinting." Doing this helps protect your incision. It also decreases belly discomfort.  If you are being  admitted to the hospital overnight, leave your suitcase in the car. After surgery it may be brought to your room.  If you are being discharged the day of surgery, you will not be allowed to drive home. You will need a responsible adult (18 years or older) to drive you home and stay with you that night.   If you are taking public transportation, you will need to have a responsible adult (18 years or older) with  you. Please confirm with your physician that it is acceptable to use public transportation.   Please call the [place] Dept. at [phone] if you have any questions about these instructions.  Surgery Visitation Policy:  Patients undergoing a surgery or procedure may have one family member or support person with them as long as that person is not COVID-19 positive or experiencing its symptoms.  That person may remain in the waiting area during the procedure.  Inpatient Visitation:    Visiting hours are 7 a.m. to 8 p.m. Inpatients will be allowed two visitors daily. The visitors may change each day during the patient's stay. No visitors under the age of 58. Any visitor under the age of 78 must be accompanied by an adult. The visitor must pass COVID-19 screenings, use hand sanitizer when entering and exiting the patient's room and wear a mask at all times, including in the patient's room. Patients must also wear a mask when staff or their visitor are in the room. Masking is required regardless of vaccination status.

## 2020-07-05 ENCOUNTER — Other Ambulatory Visit: Payer: Self-pay

## 2020-07-05 ENCOUNTER — Encounter
Admission: RE | Admit: 2020-07-05 | Discharge: 2020-07-05 | Disposition: A | Discharge status: Home / Self Care | Payer: BC Managed Care – PPO | Source: Ambulatory Visit | Attending: Urology | Admitting: Urology

## 2020-07-05 DIAGNOSIS — Z01810 Encounter for preprocedural cardiovascular examination: Secondary | ICD-10-CM | POA: Insufficient documentation

## 2020-07-05 DIAGNOSIS — R972 Elevated prostate specific antigen [PSA]: Secondary | ICD-10-CM | POA: Diagnosis present

## 2020-07-05 DIAGNOSIS — Z801 Family history of malignant neoplasm of trachea, bronchus and lung: Secondary | ICD-10-CM | POA: Diagnosis not present

## 2020-07-05 DIAGNOSIS — C61 Malignant neoplasm of prostate: Secondary | ICD-10-CM | POA: Diagnosis not present

## 2020-07-06 NOTE — H&P (Signed)
Samuel Barnett, Samuel Barnett MEDICAL RECORD NO: [id number] ACCOUNT NO: [id number] DATE OF BIRTH: 08-31-1955 FACILITY: [facility] LOCATION: [facility] PHYSICIAN: Otelia Limes. Yves Dill, MD  History and Physical   DATE OF ADMISSION: 06/07/2020  Same day surgery 07/07/2020  CHIEF COMPLAINT:  Elevated PSA and abnormal MRI scan.  HISTORY OF PRESENT ILLNESS:  The patient is a 64 year old white male with an elevated PSA of 14.0 ng/mL This was evaluated with MRI scan indicating a 1.5 cm PI-RADS category 5 lesion involving the right mid gland peripheral zone.  He comes in now for  UroNav fusion biopsy of the prostate.  PAST MEDICAL HISTORY:  No drug allergies.  CURRENT MEDICATIONS:  Juice plus, vitamin C, vitamin D, prostate supplement and aspirin.  PAST SURGICAL HISTORY:   1.  Tonsillectomy 1961. 2.  Colonoscopy 2015. 3.  Spermatocelectomy 2017.  PAST AND CURRENT MEDICAL CONDITIONS:  The patient denied heart disease, lung disease, diabetes or stroke.  REVIEW OF SYSTEMS:  The patient denied weight change, fatigue, fever, chills, weight loss, loss of appetite, chest pain or dizziness.  SOCIAL HISTORY:  The patient denied tobacco use.  Consumes 2-4 alcoholic beverages per week.   FAMILY HISTORY:  Father is living without health problems, age 78.  Mother died at age 107 of lung cancer.  There is no family history of prostate cancer.  PHYSICAL EXAMINATION:   VITAL SIGNS:  Height was 6 feet 2 inches, weight 204 pounds, BMI 26.   GENERAL:  Well-nourished white male in no acute distress. HEENT:  Sclerae were clear.  Pupils are equally round, reactive to light and accommodation.  Extraocular movements are intact. NECK:  No palpable masses or tenderness.  No audible carotid bruits.   LYMPHATIC:  No palpable cervical or inguinal adenopathy. LUNGS:  Clear to auscultation. CARDIOVASCULAR:  Regular rhythm and rate without audible murmurs. ABDOMEN:  Soft, nontender.  GENITOURINARY:  He was circumcised.  Testes  were both smooth, nontender, approximately 20 mL in size each.   RECTAL EXAM:  Internal hemorrhoids.  Prostate gland 35 grams, smooth, and nontender. NEUROLOGIC:  Alert and oriented x3.  ASSESSMENT AND PLAN:   1.  Elevated PSA. 2.  Abnormal prostate MRI scan with a prostate volume of 59 mL and a 1.5 cm PI-RADS category 5 lesion involving the right mid gland peripheral zone.    PLAN:  UroNav fusion biopsy of prostate.     Elián.Darby D: 07/06/2020 8:26:21 am T: 07/06/2020 9:37:00 am  JOB: [id number]/ [id number]

## 2020-07-07 ENCOUNTER — Ambulatory Visit
Admission: RE | Admit: 2020-07-07 | Discharge: 2020-07-07 | Disposition: A | Discharge status: Home / Self Care | Payer: BC Managed Care – PPO | Attending: Urology | Admitting: Urology

## 2020-07-07 ENCOUNTER — Ambulatory Visit: Payer: BC Managed Care – PPO | Admitting: Anesthesiology

## 2020-07-07 ENCOUNTER — Ambulatory Visit: Payer: BC Managed Care – PPO | Admitting: Urgent Care

## 2020-07-07 ENCOUNTER — Encounter: Payer: Self-pay | Admitting: Urology

## 2020-07-07 ENCOUNTER — Encounter
Admission: RE | Disposition: A | Discharge status: Home / Self Care | Payer: Self-pay | Source: Home / Self Care | Attending: Urology

## 2020-07-07 ENCOUNTER — Other Ambulatory Visit: Payer: Self-pay

## 2020-07-07 DIAGNOSIS — C61 Malignant neoplasm of prostate: Secondary | ICD-10-CM | POA: Diagnosis not present

## 2020-07-07 DIAGNOSIS — Z801 Family history of malignant neoplasm of trachea, bronchus and lung: Secondary | ICD-10-CM | POA: Insufficient documentation

## 2020-07-07 DIAGNOSIS — R972 Elevated prostate specific antigen [PSA]: Secondary | ICD-10-CM | POA: Insufficient documentation

## 2020-07-07 HISTORY — PX: PROSTATE BIOPSY: SHX241

## 2020-07-07 SURGERY — BIOPSY, PROSTATE
Anesthesia: General

## 2020-07-07 MED ORDER — FENTANYL CITRATE (PF) 100 MCG/2ML IJ SOLN
INTRAMUSCULAR | Status: AC
  Filled 2020-07-07: qty 2

## 2020-07-07 MED ORDER — LIDOCAINE HCL (CARDIAC) PF 100 MG/5ML IV SOSY
PREFILLED_SYRINGE | INTRAVENOUS | Status: DC | PRN
  Administered 2020-07-07: 100 mg via INTRAVENOUS

## 2020-07-07 MED ORDER — FAMOTIDINE 20 MG PO TABS
20.0000 mg | ORAL_TABLET | Freq: Once | ORAL | Status: AC
  Administered 2020-07-07: 20 mg via ORAL

## 2020-07-07 MED ORDER — FAMOTIDINE 20 MG PO TABS
ORAL_TABLET | ORAL | Status: AC
  Filled 2020-07-07: qty 1

## 2020-07-07 MED ORDER — ONDANSETRON HCL 4 MG/2ML IJ SOLN
INTRAMUSCULAR | Status: AC
  Filled 2020-07-07: qty 2

## 2020-07-07 MED ORDER — MIDAZOLAM HCL 2 MG/2ML IJ SOLN
INTRAMUSCULAR | Status: DC | PRN
  Administered 2020-07-07: 2 mg via INTRAVENOUS

## 2020-07-07 MED ORDER — EPHEDRINE SULFATE 50 MG/ML IJ SOLN
INTRAMUSCULAR | Status: DC | PRN
  Administered 2020-07-07: 10 mg via INTRAVENOUS

## 2020-07-07 MED ORDER — CEFAZOLIN SODIUM-DEXTROSE 1-4 GM/50ML-% IV SOLN
INTRAVENOUS | Status: AC
  Filled 2020-07-07: qty 50

## 2020-07-07 MED ORDER — ONDANSETRON HCL 4 MG/2ML IJ SOLN
INTRAMUSCULAR | Status: DC | PRN
  Administered 2020-07-07: 4 mg via INTRAVENOUS

## 2020-07-07 MED ORDER — ROCURONIUM BROMIDE 10 MG/ML (PF) SYRINGE
PREFILLED_SYRINGE | INTRAVENOUS | Status: AC
  Filled 2020-07-07: qty 10

## 2020-07-07 MED ORDER — MIDAZOLAM HCL 2 MG/2ML IJ SOLN
INTRAMUSCULAR | Status: AC
  Filled 2020-07-07: qty 2

## 2020-07-07 MED ORDER — CHLORHEXIDINE GLUCONATE 0.12 % MT SOLN
OROMUCOSAL | Status: AC
  Filled 2020-07-07: qty 15

## 2020-07-07 MED ORDER — DEXAMETHASONE SODIUM PHOSPHATE 10 MG/ML IJ SOLN
INTRAMUSCULAR | Status: AC
  Filled 2020-07-07: qty 1

## 2020-07-07 MED ORDER — OXYCODONE HCL 5 MG/5ML PO SOLN: 5.0000 mg | Freq: Once | ORAL | Status: AC | PRN

## 2020-07-07 MED ORDER — OXYCODONE HCL 5 MG PO TABS
5.0000 mg | ORAL_TABLET | Freq: Once | ORAL | Status: AC | PRN
  Administered 2020-07-07: 5 mg via ORAL

## 2020-07-07 MED ORDER — SUCCINYLCHOLINE CHLORIDE 20 MG/ML IJ SOLN
INTRAMUSCULAR | Status: DC | PRN
  Administered 2020-07-07: 120 mg via INTRAVENOUS

## 2020-07-07 MED ORDER — SUGAMMADEX SODIUM 200 MG/2ML IV SOLN
INTRAVENOUS | Status: DC | PRN
  Administered 2020-07-07: 200 mg via INTRAVENOUS

## 2020-07-07 MED ORDER — LIDOCAINE HCL (PF) 2 % IJ SOLN
INTRAMUSCULAR | Status: AC
  Filled 2020-07-07: qty 5

## 2020-07-07 MED ORDER — FLEET ENEMA 7-19 GM/118ML RE ENEM
1.0000 | ENEMA | Freq: Once | RECTAL | Status: AC
  Administered 2020-07-07: 1 via RECTAL

## 2020-07-07 MED ORDER — FENTANYL CITRATE (PF) 100 MCG/2ML IJ SOLN: 25.0000 ug | INTRAMUSCULAR | Status: DC | PRN

## 2020-07-07 MED ORDER — CEFAZOLIN SODIUM-DEXTROSE 1-4 GM/50ML-% IV SOLN
1.0000 g | Freq: Once | INTRAVENOUS | Status: AC
  Administered 2020-07-07: 1 g via INTRAVENOUS

## 2020-07-07 MED ORDER — PROPOFOL 10 MG/ML IV BOLUS
INTRAVENOUS | Status: DC | PRN
  Administered 2020-07-07: 150 mg via INTRAVENOUS

## 2020-07-07 MED ORDER — GENTAMICIN SULFATE 40 MG/ML IJ SOLN
80.0000 mg | Freq: Once | INTRAMUSCULAR | Status: DC
Start: 2020-07-07 — End: 2020-07-07
  Filled 2020-07-07: qty 2

## 2020-07-07 MED ORDER — DEXAMETHASONE SODIUM PHOSPHATE 10 MG/ML IJ SOLN
INTRAMUSCULAR | Status: DC | PRN
  Administered 2020-07-07: 10 mg via INTRAVENOUS

## 2020-07-07 MED ORDER — LACTATED RINGERS IV SOLN: INTRAVENOUS | Status: DC

## 2020-07-07 MED ORDER — OXYCODONE HCL 5 MG PO TABS
ORAL_TABLET | ORAL | Status: AC
  Filled 2020-07-07: qty 1

## 2020-07-07 MED ORDER — GENTAMICIN IN SALINE 1.6-0.9 MG/ML-% IV SOLN
80.0000 mg | Freq: Once | INTRAVENOUS | Status: AC
  Administered 2020-07-07: 80 mg via INTRAVENOUS
  Filled 2020-07-07: qty 50

## 2020-07-07 MED ORDER — FENTANYL CITRATE (PF) 100 MCG/2ML IJ SOLN
INTRAMUSCULAR | Status: DC | PRN
  Administered 2020-07-07 (×2): 50 ug via INTRAVENOUS

## 2020-07-07 MED ORDER — SUCCINYLCHOLINE CHLORIDE 200 MG/10ML IV SOSY
PREFILLED_SYRINGE | INTRAVENOUS | Status: AC
  Filled 2020-07-07: qty 10

## 2020-07-07 MED ORDER — ORAL CARE MOUTH RINSE: 15.0000 mL | Freq: Once | OROMUCOSAL | Status: AC

## 2020-07-07 MED ORDER — CHLORHEXIDINE GLUCONATE 0.12 % MT SOLN
15.0000 mL | Freq: Once | OROMUCOSAL | Status: AC
  Administered 2020-07-07: 15 mL via OROMUCOSAL

## 2020-07-07 MED ORDER — ROCURONIUM BROMIDE 100 MG/10ML IV SOLN
INTRAVENOUS | Status: DC | PRN
  Administered 2020-07-07: 10 mg via INTRAVENOUS
  Administered 2020-07-07: 20 mg via INTRAVENOUS

## 2020-07-07 MED ORDER — LEVOFLOXACIN 500 MG PO TABS: 500.0000 mg | ORAL_TABLET | Freq: Every day | ORAL | 1 refills | Status: DC

## 2020-07-07 SURGICAL SUPPLY — 18 items
COVER MAYO STAND REUSABLE (DRAPES) ×2 IMPLANT
COVER TRANSDUCER ULTRASOUND (MISCELLANEOUS) ×2 IMPLANT
GLOVE SURG ENC MOIS LTX SZ7 (GLOVE) ×4 IMPLANT
GUIDE NEEDLE ENDOCAV 16-18 CVR (NEEDLE) IMPLANT
GUIDE NEEDLE URONAV ULTRASND S (MISCELLANEOUS) ×1 IMPLANT
INST BIOPSY MAXCORE 18GX25 (NEEDLE) ×2 IMPLANT
MANIFOLD NEPTUNE II (INSTRUMENTS) ×2 IMPLANT
NEEDLE GUIDE BIOPSY 644068 (NEEDLE) IMPLANT
PROBE BIOSP ALOKA ALPHA6 PROST (MISCELLANEOUS) ×2 IMPLANT
PROBE URONAV BK 8808E 8818 HLD (MISCELLANEOUS) ×1 IMPLANT
STRAP SAFETY 5IN WIDE (MISCELLANEOUS) ×2 IMPLANT
SURGILUBE 2OZ TUBE FLIPTOP (MISCELLANEOUS) ×2 IMPLANT
TOWEL OR 17X26 4PK STRL BLUE (TOWEL DISPOSABLE) ×2 IMPLANT
URONAV BK 8808E 8818 PROBE HLD (MISCELLANEOUS) ×2
URONAV MRI FUSION ONE PATIENT (MISCELLANEOUS) ×2 IMPLANT
URONAV MRI FUSION TWO PATIENTS (MISCELLANEOUS) IMPLANT
URONAV ULTRASOUND (MISCELLANEOUS) ×2 IMPLANT
URONAV ULTRASOUND NDL GUIDE S (MISCELLANEOUS) ×2

## 2020-07-07 NOTE — Interval H&P Note (Signed)
History and Physical Interval Note:  07/07/2020 7:34 AM  Samuel Barnett  has presented today for surgery, with the diagnosis of ELEVATED PSA.  The various methods of treatment have been discussed with the patient and family. After consideration of risks, benefits and other options for treatment, the patient has consented to  Procedure(s): [place] (N/A) as a surgical intervention.  The patient's history has been reviewed, patient examined, no change in status, stable for surgery.  I have reviewed the patient's chart and labs.  Questions were answered to the patient's satisfaction.     Royston Cowper

## 2020-07-07 NOTE — Anesthesia Postprocedure Evaluation (Signed)
Anesthesia Post Note  Patient: Estate manager/land agent  Procedure(s) Performed: PROSTATE BIOPSY URONAV  Patient location during evaluation: PACU Anesthesia Type: General Level of consciousness: awake and alert Pain management: pain level controlled Vital Signs Assessment: post-procedure vital signs reviewed and stable Respiratory status: spontaneous breathing, nonlabored ventilation, respiratory function stable and patient connected to nasal cannula oxygen Cardiovascular status: blood pressure returned to baseline and stable Postop Assessment: no apparent nausea or vomiting Anesthetic complications: no   No notable events documented.   Last Vitals:  Vitals:   07/07/20 0903 07/07/20 0938  BP: (!) 146/81 (!) 154/72  Pulse: 67 65  Resp: 20 20  Temp: (!) 36.1 C (!) 36.1 C  SpO2: 98% 98%    Last Pain:  Vitals:   07/07/20 0903  TempSrc: Temporal  PainSc: 3                  Precious Haws Caelin Rosen

## 2020-07-07 NOTE — Op Note (Signed)
Preoperative diagnosis: 1.  Elevated PSA (C61)                                           2.  Abnormal prostate gland MRI scan (D40.0)  Postoperative diagnosis: Same  Procedure: Transrectal Uronav fusion biopsy of the prostate ([facility] [place], [phone])  Surgeon: Otelia Limes. Yves Dill MD  Anesthesia: General  Indications:See the history and physical. After informed consent the above procedure(s) were requested     Technique and findings: After adequate general anesthesia been obtained the patient was placed into the left lateral decubitus position.  DRE was performed and rectal vault noted to be clear.  The ultrasound probe was then placed and images acquired.  The ultrasound images were then fused with the MRI images and region of interest #1 was identified and 5 core biopsies taken here.  At this point standard 12 core systematic biopsies were performed.  The ultrasound probe was then removed.  Blood loss was minimal.  The procedure was then terminated and the patient transferred to the recovery room in stable condition.

## 2020-07-07 NOTE — Transfer of Care (Signed)
Immediate Anesthesia Transfer of Care Note  Patient: Samuel Barnett  Procedure(s) Performed: PROSTATE BIOPSY URONAV  Patient Location: PACU  Anesthesia Type:General  Level of Consciousness: sedated  Airway & Oxygen Therapy: Patient Spontanous Breathing and Patient connected to face mask oxygen  Post-op Assessment: Report given to RN and Post -op Vital signs reviewed and stable  Post vital signs: Reviewed and stable  Last Vitals:  Vitals Value Taken Time  BP 150/78 07/07/20 0826  Temp    Pulse 70 07/07/20 0828  Resp 12 07/07/20 0828  SpO2 100 % 07/07/20 0828  Vitals shown include unvalidated device data.  Last Pain:  Vitals:   07/07/20 0619  TempSrc: Oral         Complications: No notable events documented.

## 2020-07-07 NOTE — Anesthesia Preprocedure Evaluation (Signed)
Anesthesia Evaluation  Patient identified by MRN, date of birth, ID band Patient awake    Reviewed: Allergy & Precautions, NPO status , Patient's Chart, lab work & pertinent test results  History of Anesthesia Complications Negative for: history of anesthetic complications  Airway Mallampati: II  TM Distance: >3 FB Neck ROM: full    Dental  (+) Chipped   Pulmonary neg pulmonary ROS, neg shortness of breath,    Pulmonary exam normal        Cardiovascular (-) angina(-) Past MI and (-) DOE negative cardio ROS Normal cardiovascular exam     Neuro/Psych negative neurological ROS  negative psych ROS   GI/Hepatic Neg liver ROS, GERD  Medicated and Controlled,  Endo/Other  negative endocrine ROS  Renal/GU      Musculoskeletal   Abdominal   Peds  Hematology negative hematology ROS (+)   Anesthesia Other Findings Past Medical History: No date: Elevated PSA No date: GERD (gastroesophageal reflux disease)     Comment:  h/o No date: Hemorrhoids No date: History of gastroesophageal reflux (GERD) No date: Spermatocele     Comment:  RIGHT  Past Surgical History: 2014: COLONOSCOPY WITH PROPOFOL 11/09/2013: SCROTAL EXPLORATION; Right     Comment:  Procedure: SCROTUM EXPLORATION;  Surgeon: Bernestine Amass, MD;  Location: [facility];                Service: Urology;  Laterality: Right; 11/09/2013: SPERMATOCELECTOMY; Right     Comment:  Procedure: SPERMATOCELECTOMY;  Surgeon: Bernestine Amass,               MD;  Location: [facility];  Service:               Urology;  Laterality: Right; age 65: TONSILLECTOMY  BMI    Body Mass Index: 25.14 kg/m      Reproductive/Obstetrics negative OB ROS                             Anesthesia Physical Anesthesia Plan  ASA: 2  Anesthesia Plan: General ETT   Post-op Pain Management:    Induction:  Intravenous  PONV Risk Score and Plan: Ondansetron, Dexamethasone, Midazolam and Treatment may vary due to age or medical condition  Airway Management Planned: Oral ETT  Additional Equipment:   Intra-op Plan:   Post-operative Plan: Extubation in OR  Informed Consent: I have reviewed the patients History and Physical, chart, labs and discussed the procedure including the risks, benefits and alternatives for the proposed anesthesia with the patient or authorized representative who has indicated his/her understanding and acceptance.     Dental Advisory Given  Plan Discussed with: Anesthesiologist, CRNA and Surgeon  Anesthesia Plan Comments: (Patient consented for risks of anesthesia including but not limited to:  - adverse reactions to medications - damage to eyes, teeth, lips or other oral mucosa - nerve damage due to positioning  - sore throat or hoarseness - Damage to heart, brain, nerves, lungs, other parts of body or loss of life  Patient voiced understanding.)        Anesthesia Quick Evaluation

## 2020-07-07 NOTE — Discharge Instructions (Addendum)
Transrectal Ultrasound-Guided Prostate Biopsy, Care After This sheet gives you information about how to care for yourself after your procedure. Your doctor may also give you more specific instructions. If youhave problems or questions, contact your doctor. What can I expect after the procedure? After the procedure, it is common to have: Pain and discomfort in your butt, especially while sitting. Pink-colored pee (urine), due to small amounts of blood in the pee. Burning while peeing (urinating). Blood in your poop (stool). Bleeding from your butt. Blood in your semen. Follow these instructions at home: Medicines Take over-the-counter and prescription medicines only as told by your doctor. If you were prescribed antibiotic medicine, take it as told by your doctor. Do not stop taking the antibiotic even if you start to feel better. Activity  Do not drive for 24 hours if you were given a medicine to help you relax (sedative) during your procedure. Return to your normal activities as told by your doctor. Ask your doctor what activities are safe for you. Ask your doctor when it is okay for you to have sex. Do not lift anything that is heavier than 10 lb (4.5 kg), or the limit that you are told, until your doctor says that it is safe.   General instructions  Drink enough water to keep your pee pale yellow. Watch your pee, poop, and semen for new bleeding or bleeding that gets worse. Keep all follow-up visits as told by your doctor. This is important.   Contact a doctor if you: Have blood clots in your pee or poop. Notice that your pee smells bad or unusual. Have very bad belly pain. Have trouble peeing. Notice that your lower belly feels firm. Have blood in your pee for more than 2 weeks after the procedure. Have blood in your semen for more than 2 months after the procedure. Have problems getting an erection. Feel sick to your stomach (nauseous) or throw up (vomit). Have new or worse  bleeding in your pee, poop, or semen. Get help right away if you: Have a fever or chills. Have bright red pee. Have very bad pain that does not get better with medicine. Cannot pee. Summary After this procedure, it is common to have pain and discomfort around your butt, especially while sitting. You may have blood in your pee and poop. It is common to have blood in your semen for 1-2 months. If you were prescribed antibiotic medicine, take it as told by your doctor. Do not stop taking the antibiotic even if you start to feel better. Get help right away if you have a fever or chills. This information is not intended to replace advice given to you by your health care provider. Make sure you discuss any questions you have with your healthcare provider. Document Revised: 11/02/2019 Document Reviewed: 09/03/2019 Elsevier Patient Education  2022 [place]   The drugs that you were given will stay in your system until tomorrow so for the next 24 hours you should not:  Drive an automobile Make any legal decisions Drink any alcoholic beverage   You may resume regular meals tomorrow.  Today it is better to start with liquids and gradually work up to solid foods.  You may eat anything you prefer, but it is better to start with liquids, then soup and crackers, and gradually work up to solid foods.   Please notify your doctor immediately if you have any unusual bleeding, trouble breathing, redness  and pain at the surgery site, drainage, fever, or pain not relieved by medication.    Additional Instructions:        Please contact your physician with any problems or Same Day Surgery at [phone], Monday through Friday 6 am to 4 pm, or  at [facility] number at [phone].

## 2020-07-07 NOTE — Anesthesia Procedure Notes (Signed)
Procedure Name: Intubation Date/Time: 07/07/2020 7:45 AM Performed by: Daryel Gerald, RN Pre-anesthesia Checklist: Patient identified, Patient being monitored, Timeout performed, Emergency Drugs available and Suction available Patient Re-evaluated:Patient Re-evaluated prior to induction Oxygen Delivery Method: Circle system utilized Preoxygenation: Pre-oxygenation with 100% oxygen Induction Type: IV induction Ventilation: Mask ventilation without difficulty Laryngoscope Size: Mac and 3 Grade View: Grade I Tube type: Oral Tube size: 7.5 mm Number of attempts: 1 Airway Equipment and Method: Stylet Placement Confirmation: ETT inserted through vocal cords under direct vision, positive ETCO2 and breath sounds checked- equal and bilateral Secured at: 22 cm Tube secured with: Tape Dental Injury: Teeth and Oropharynx as per pre-operative assessment

## 2020-07-07 NOTE — H&P (Signed)
Date of Initial H&P: 07/08/19  History reviewed, patient examined, no change in status, stable for surgery.

## 2020-07-08 ENCOUNTER — Encounter: Payer: Self-pay | Admitting: Urology

## 2020-07-08 LAB — SURGICAL PATHOLOGY

## 2020-07-12 ENCOUNTER — Encounter: Payer: Self-pay | Admitting: Internal Medicine

## 2020-07-12 DIAGNOSIS — C61 Malignant neoplasm of prostate: Secondary | ICD-10-CM | POA: Insufficient documentation

## 2020-07-12 DIAGNOSIS — R972 Elevated prostate specific antigen [PSA]: Secondary | ICD-10-CM | POA: Insufficient documentation

## 2020-07-13 ENCOUNTER — Other Ambulatory Visit: Payer: Self-pay | Admitting: Urology

## 2020-07-13 DIAGNOSIS — C61 Malignant neoplasm of prostate: Secondary | ICD-10-CM

## 2020-07-21 ENCOUNTER — Ambulatory Visit
Admission: RE | Admit: 2020-07-21 | Discharge: 2020-07-21 | Disposition: A | Discharge status: Home / Self Care | Payer: BC Managed Care – PPO | Source: Ambulatory Visit | Attending: Urology | Admitting: Urology

## 2020-07-21 ENCOUNTER — Other Ambulatory Visit: Payer: Self-pay

## 2020-07-21 ENCOUNTER — Encounter
Admission: RE | Admit: 2020-07-21 | Discharge: 2020-07-21 | Disposition: A | Discharge status: Home / Self Care | Payer: BC Managed Care – PPO | Source: Ambulatory Visit | Attending: Urology | Admitting: Urology

## 2020-07-21 ENCOUNTER — Ambulatory Visit: Payer: BC Managed Care – PPO

## 2020-07-21 DIAGNOSIS — C61 Malignant neoplasm of prostate: Secondary | ICD-10-CM

## 2020-07-21 LAB — POCT I-STAT CREATININE: Creatinine, Ser: 1 mg/dL (ref 0.61–1.24)

## 2020-07-21 IMAGING — CT CT ABD-PEL WO/W CM
2 of 9 series · 12 of 46 positions shown, 14 images · IV contrast (omnipaque)
Comparison: MR prostate [DATE]

CLINICAL DATA: Prostate cancer staging

EXAM:
CT ABDOMEN AND PELVIS WITHOUT AND WITH CONTRAST
TECHNIQUE: Multidetector CT imaging of the abdomen and pelvis was performed
following the standard protocol before and following the bolus
administration of intravenous contrast.
CONTRAST:  100mL OMNIPAQUE IOHEXOL 300 MG/ML  SOLN

[Series 2: abd/pel pre · axial · non-contrast · 0.78mm/px · z∈[-543,-153]mm · 9 of 98 slices shown, 11 images]
[im 10/98  soft-tissue]
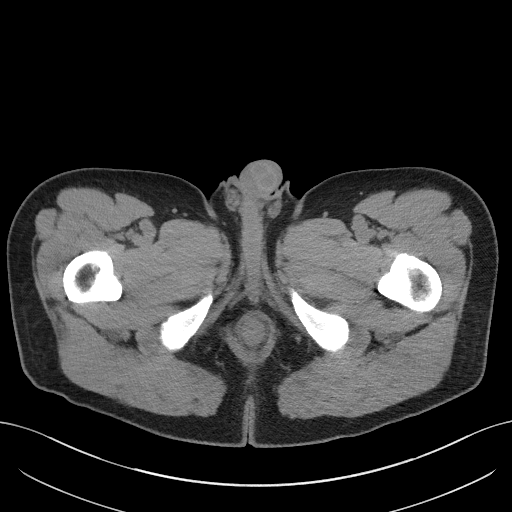
[im 10/98  bone]
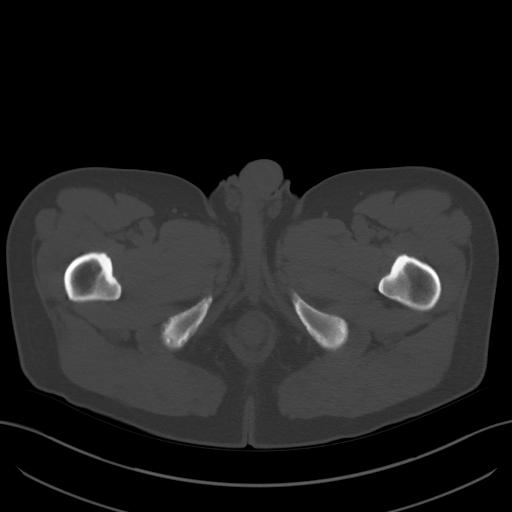
[im 20/98  soft-tissue]
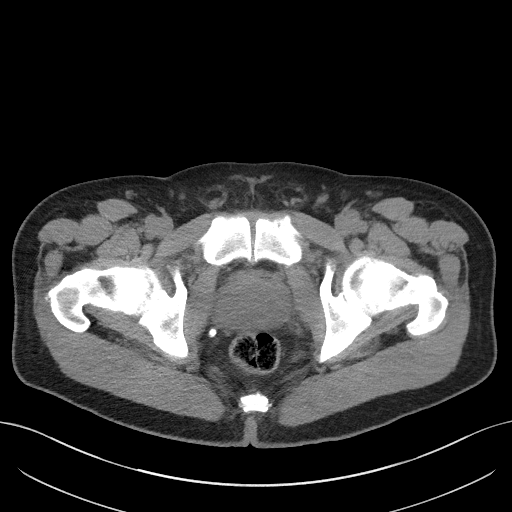
[im 30/98  soft-tissue]
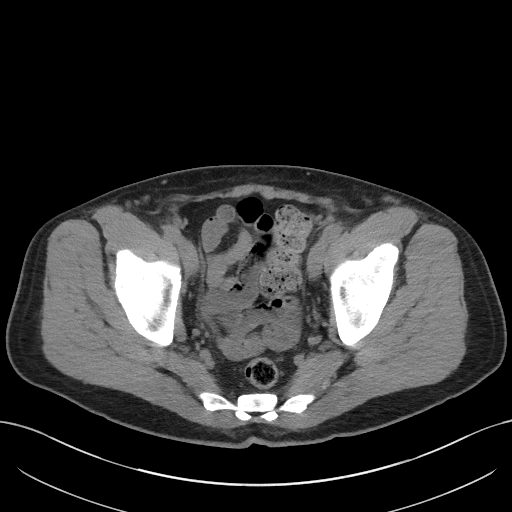
[im 39/98  soft-tissue]
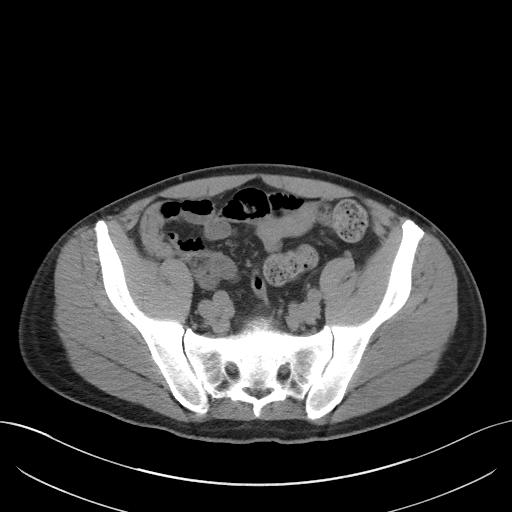
[im 49/98  soft-tissue]
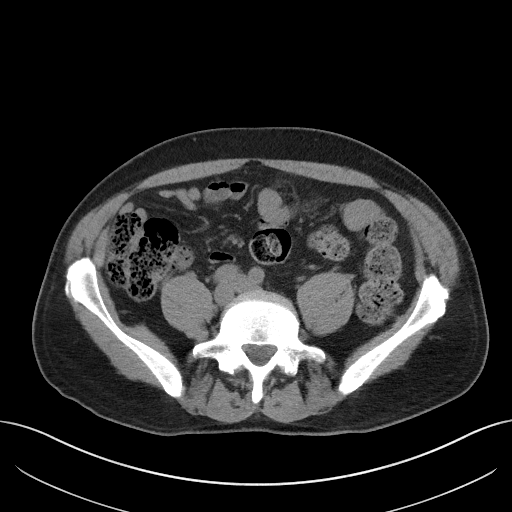
[im 59/98  soft-tissue]
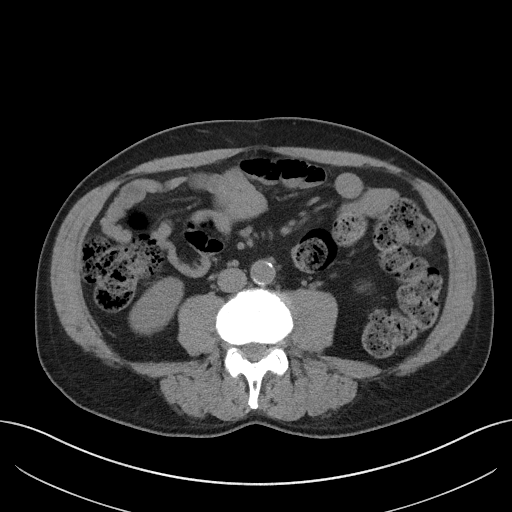
[im 68/98  soft-tissue]
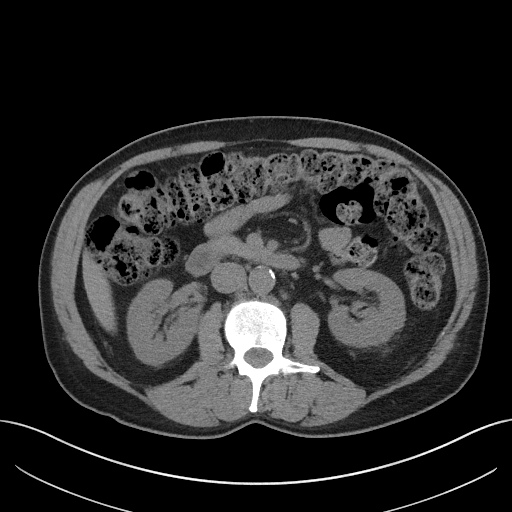
[im 78/98  soft-tissue]
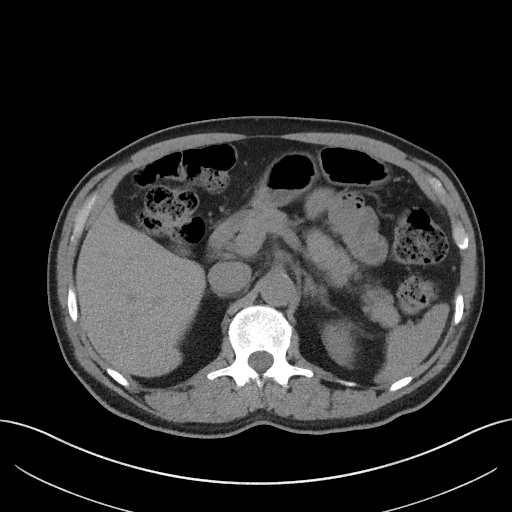
[im 88/98  soft-tissue]
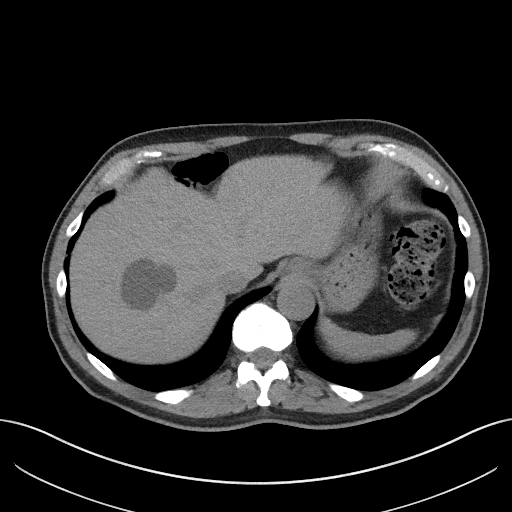
[im 88/98  bone]
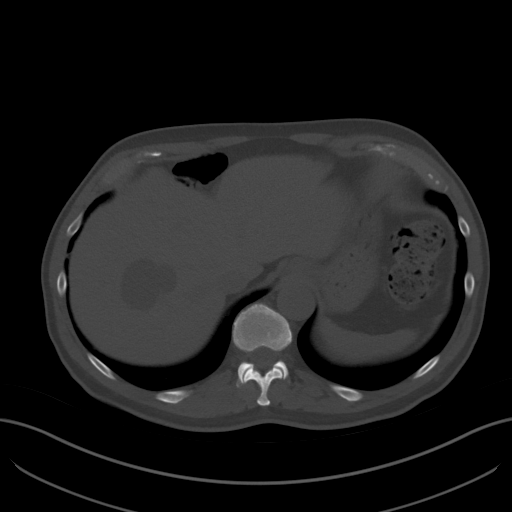

[Series 5: coronal st · coronal · 0.77mm/px · 3 of 88 slices shown]
[im 22/88  soft-tissue]
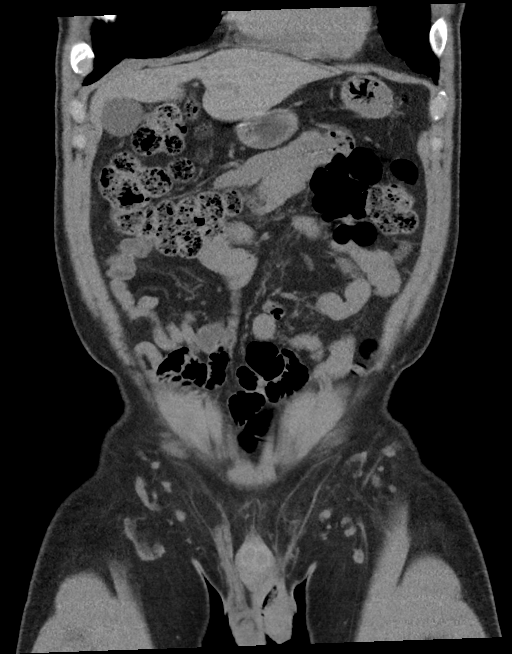
[im 44/88  soft-tissue]
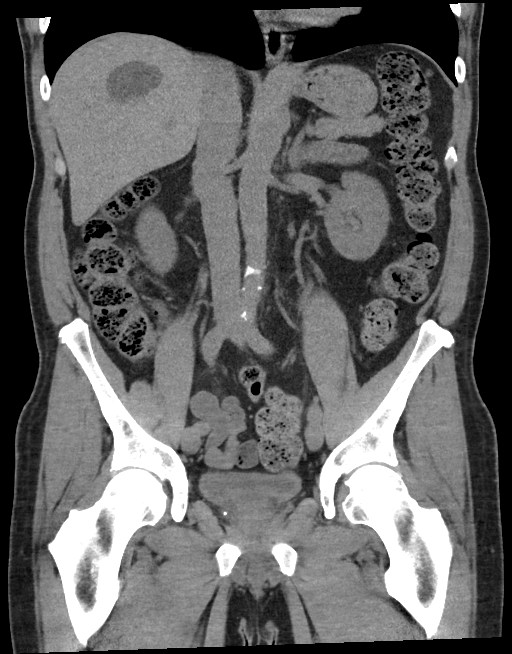
[im 66/88  soft-tissue]
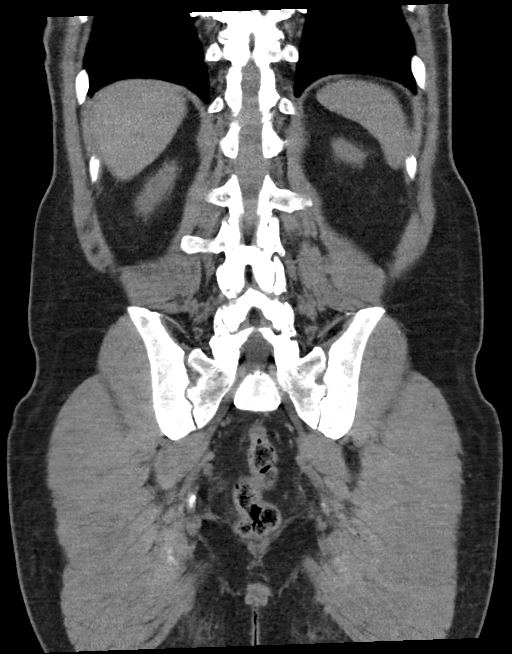

[12 of 46 positions shown; findings below may reference images not displayed]

FINDINGS: Lower chest: 2 mm left lower lobe pulmonary nodule, image 7 series
4.

Hepatobiliary: Multiple hypodense hepatic lesions favor cysts, with
several small hypodense lesions near the gallbladder fossa
technically too small to characterize. Bilobed lesion in the right
hepatic lobe measuring 4.6 by 4.2 cm on image 10 series 7 deviates
adjacent portal venous structure and may have a single septation
versus representing 2 adjacent cysts. No appreciable internal
nodularity. Gallbladder unremarkable.

Pancreas: Along the upper margin of the pancreatic head and in close
proximity to the common bile duct, a 0.8 by 1.1 by 1.0 cm cystic
lesion is present on image 23 series 7. I am skeptical that this
represents a diverticulum from the CBD. This is probably either a
postinflammatory cystic lesion of the pancreas or small intraductal
papillary mucinous neoplasm.

Spleen: Unremarkable

Adrenals/Urinary Tract: Unremarkable

Stomach/Bowel: Prominent stool throughout the colon favors
constipation.

Vascular/Lymphatic: Aortoiliac atherosclerotic vascular disease. No
pathologic adenopathy identified.

Reproductive: Prostatomegaly.

Other: No supplemental non-categorized findings.

Musculoskeletal: Suspected hemangioma in the L3 vertebral body. No
characteristic findings of metastatic prostate cancer to bone.
Lumbar spondylosis and degenerative disc disease causing bilateral
foraminal impingement at L3-4 and L4-5.
IMPRESSION: 1. No specific findings of metastatic disease. A 2 mm left lower
lobe pulmonary nodule is probably benign but may merit surveillance
in this context.
2. 1.1 cm cystic lesion along the upper margin of the pancreatic
head, possibly a postinflammatory lesion or intraductal papillary
mucinous neoplasm. Follow up pancreatic protocol MRI or CT with and
without contrast is recommended in 2 years time. This recommendation
follows ACR consensus guidelines: Management of Incidental
Pancreatic Cysts: A White Paper of the ACR Incidental Findings
Committee. [HOSPITAL] [N1];[DATE].
3. Hepatic cysts noted.
4.  Prominent stool throughout the colon favors constipation.
5.  Aortic Atherosclerosis ([N1]-[N1]).
6. Lumbar impingement at L3-4 and L4-5.

## 2020-07-21 IMAGING — NM NM BONE WHOLE BODY
1 series · 2 of 2 positions shown · non-contrast
Comparison: CT abdomen [DATE]

CLINICAL DATA: Prostate cancer.

EXAM:
NUCLEAR MEDICINE WHOLE BODY BONE SCAN
TECHNIQUE: Whole body anterior and posterior images were obtained approximately
3 hours after intravenous injection of radiopharmaceutical.
RADIOPHARMACEUTICALS:  20.0 mCi [5G] MDP IV

[Series 1000: 3 hr wholebody · 2.40mm/px · 2 of 2 frames shown]
[frame 1/2]
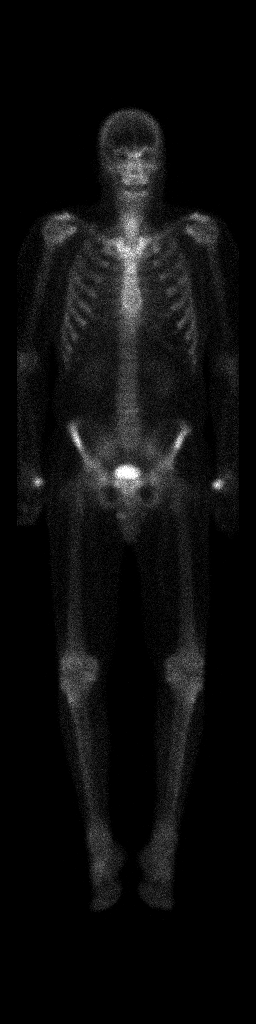
[frame 2/2]
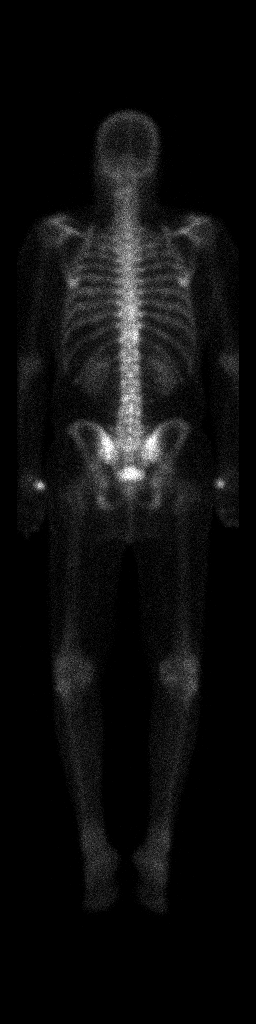

[2 of 2 positions shown; findings below may reference images not displayed]

FINDINGS: Degenerative activity at the thumb bases bilaterally. Mild
degenerative activity in the thoracolumbar spine. Mild degenerative
findings in the knees and AC joints.

No compelling evidence of metastatic disease to the skeleton.
IMPRESSION: 1. No findings of metastatic disease to the skeleton.
2. Scattered degenerative findings as noted above.

## 2020-07-21 MED ORDER — TECHNETIUM TC 99M MEDRONATE IV KIT
20.0000 | PACK | Freq: Once | INTRAVENOUS | Status: AC | PRN
  Administered 2020-07-21: 20.03 via INTRAVENOUS

## 2020-07-21 MED ORDER — IOHEXOL 300 MG/ML  SOLN
100.0000 mL | Freq: Once | INTRAMUSCULAR | Status: AC | PRN
  Administered 2020-07-21: 100 mL via INTRAVENOUS

## 2020-07-21 MED ORDER — TECHNETIUM TC 99M MEDRONATE IV KIT: 20.0000 | PACK | Freq: Once | INTRAVENOUS | Status: DC | PRN

## 2020-07-27 ENCOUNTER — Telehealth: Payer: Self-pay | Admitting: Internal Medicine

## 2020-07-27 NOTE — Telephone Encounter (Signed)
Spoke with pt and scheduled him for an appt with Dr. Derrel Nip

## 2020-07-27 NOTE — Telephone Encounter (Signed)
His urologist sent me a copy of his CT abdomen and pelvis.  If he still considers me his patient (last seen in 2014!) needs to schedule a follow up to discuss multiple findings.  We will need 45 minutes since this is technically a "new patient"

## 2020-08-04 ENCOUNTER — Other Ambulatory Visit: Payer: BC Managed Care – PPO

## 2020-08-05 ENCOUNTER — Ambulatory Visit: Payer: BC Managed Care – PPO

## 2020-08-31 ENCOUNTER — Ambulatory Visit: Payer: BC Managed Care – PPO | Admitting: Internal Medicine

## 2020-08-31 ENCOUNTER — Other Ambulatory Visit: Payer: Self-pay

## 2020-08-31 VITALS — BP 132/78 | HR 66 | Temp 98.4°F | Ht 73.25 in | Wt 196.0 lb

## 2020-08-31 DIAGNOSIS — Z23 Encounter for immunization: Secondary | ICD-10-CM

## 2020-08-31 DIAGNOSIS — E559 Vitamin D deficiency, unspecified: Secondary | ICD-10-CM

## 2020-08-31 DIAGNOSIS — Z79899 Other long term (current) drug therapy: Secondary | ICD-10-CM

## 2020-08-31 DIAGNOSIS — E291 Testicular hypofunction: Secondary | ICD-10-CM

## 2020-08-31 DIAGNOSIS — C61 Malignant neoplasm of prostate: Secondary | ICD-10-CM

## 2020-08-31 DIAGNOSIS — R5383 Other fatigue: Secondary | ICD-10-CM

## 2020-08-31 DIAGNOSIS — I700 Atherosclerosis of aorta: Secondary | ICD-10-CM

## 2020-08-31 DIAGNOSIS — R972 Elevated prostate specific antigen [PSA]: Secondary | ICD-10-CM

## 2020-08-31 DIAGNOSIS — K869 Disease of pancreas, unspecified: Secondary | ICD-10-CM

## 2020-08-31 LAB — CBC WITH DIFFERENTIAL/PLATELET
Basophils Absolute: 0.1 10*3/uL (ref 0.0–0.1)
Basophils Relative: 1.1 % (ref 0.0–3.0)
Eosinophils Absolute: 0.1 10*3/uL (ref 0.0–0.7)
Eosinophils Relative: 1.3 % (ref 0.0–5.0)
HCT: 42.4 % (ref 39.0–52.0)
Hemoglobin: 14.3 g/dL (ref 13.0–17.0)
Lymphocytes Relative: 39 % (ref 12.0–46.0)
Lymphs Abs: 1.9 10*3/uL (ref 0.7–4.0)
MCHC: 33.8 g/dL (ref 30.0–36.0)
MCV: 95.5 fl (ref 78.0–100.0)
Monocytes Absolute: 0.3 10*3/uL (ref 0.1–1.0)
Monocytes Relative: 7.2 % (ref 3.0–12.0)
Neutro Abs: 2.5 10*3/uL (ref 1.4–7.7)
Neutrophils Relative %: 51.4 % (ref 43.0–77.0)
Platelets: 260 10*3/uL (ref 150.0–400.0)
RBC: 4.43 Mil/uL (ref 4.22–5.81)
RDW: 13.8 % (ref 11.5–15.5)
WBC: 4.9 10*3/uL (ref 4.0–10.5)

## 2020-08-31 LAB — TESTOSTERONE: Testosterone: 9.27 ng/dL — ABNORMAL LOW (ref 300.00–890.00)

## 2020-08-31 LAB — COMPREHENSIVE METABOLIC PANEL
ALT: 25 U/L (ref 0–53)
AST: 22 U/L (ref 0–37)
Albumin: 4.8 g/dL (ref 3.5–5.2)
Alkaline Phosphatase: 59 U/L (ref 39–117)
BUN: 17 mg/dL (ref 6–23)
CO2: 28 mEq/L (ref 19–32)
Calcium: 9.9 mg/dL (ref 8.4–10.5)
Chloride: 102 mEq/L (ref 96–112)
Creatinine, Ser: 0.94 mg/dL (ref 0.40–1.50)
GFR: 85.14 mL/min (ref >60.00)
Glucose, Bld: 88 mg/dL (ref 70–99)
Potassium: 4 mEq/L (ref 3.5–5.1)
Sodium: 138 mEq/L (ref 135–145)
Total Bilirubin: 0.5 mg/dL (ref 0.2–1.2)
Total Protein: 7.3 g/dL (ref 6.0–8.3)

## 2020-08-31 LAB — TSH: TSH: 1.08 u[IU]/mL (ref 0.35–5.50)

## 2020-08-31 LAB — VITAMIN D 25 HYDROXY (VIT D DEFICIENCY, FRACTURES): VITD: 65.43 ng/mL (ref 30.00–100.00)

## 2020-08-31 LAB — LIPID PANEL
Cholesterol: 216 mg/dL — ABNORMAL HIGH (ref 0–200)
HDL: 84.6 mg/dL (ref >39.00)
LDL Cholesterol: 118 mg/dL — ABNORMAL HIGH (ref 0–99)
NonHDL: 131.04
Total CHOL/HDL Ratio: 3
Triglycerides: 67 mg/dL (ref 0.0–149.0)
VLDL: 13.4 mg/dL (ref 0.0–40.0)

## 2020-08-31 MED ORDER — ZOSTER VAC RECOMB ADJUVANTED 50 MCG/0.5ML IM SUSR: 0.5000 mL | Freq: Once | INTRAMUSCULAR | 1 refills | Status: AC

## 2020-08-31 NOTE — Progress Notes (Addendum)
Subjective:  Patient ID: Samuel Barnett, male    DOB: 21-Apr-1955  Age: 65 y.o. MRN: [id number]  CC: The primary encounter diagnosis was Need for immunization against influenza. 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   Gleason score of 7:  4 + 3,  has begun treatment by Dr Boneta Lucks .  Currently receiving  orgovyx with insurance forcing alternative therapy with Lupron anticipated,  with plans to have  ultrasound guided ablation once prostate has been reduce. Having hot flashes,  muscle aches  joint pain, not sleeping well but does not want therapy.   Still exercising (tennis several times per week) and working full time.  Having 3 to 6 flashes per night disrupting sleep   Recent CT Abd and pelvis reviewed with patient due to several findings :  1) 45m LLL pulmonary nodule  2) multiple hepatic cysts 3) 1.1 cystic lesion along margin of pancreatic head  4) Lumbar spondylosis nad DDD with bilateral foraminal impingement at l3-4 and L4-5 4) Aortpiliac atherosclerotic vascular disease  He denies abd pain, chest pain,  exertional dyspnea.  No history of tobacco use or alcohol overindulgence.   Outpatient Medications Prior to Visit  Medication Sig Dispense Refill   LUPRON DEPOT, 77-MONTH, 45 MG injection Inject into the muscle as directed.     Misc Natural Products (CHOLESTEROL SUPPORT PO) Take by mouth.     Nutritional Supplements (JUICE  PLUS FIBRE PO) Take 6 capsules by mouth daily.     OVER THE COUNTER MEDICATION Take 2 capsules by mouth daily. Immune Defense     Relugolix (ORGOVYX PO) Take by mouth.     levofloxacin (LEVAQUIN) 500 MG tablet Take 1 tablet (500 mg total) by mouth daily. 7 tablet 1   No facility-administered medications prior to visit.    Review of Systems;  Patient denies headache, fevers, malaise, unintentional weight loss, skin rash, eye pain, sinus congestion and sinus pain, sore throat, dysphagia,  hemoptysis , cough, dyspnea, wheezing, chest pain, palpitations, orthopnea, edema, abdominal pain, nausea, melena, diarrhea, constipation, flank pain, dysuria, hematuria, urinary  Frequency, nocturia, numbness, tingling, seizures,  Focal weakness, Loss of consciousness,  Tremor, insomnia, depression, anxiety, and suicidal ideation.      Objective:  BP 132/78   Pulse 66   Temp 98.4 F (36.9 C) (Oral)   Ht 6' 1.25" (1.861 m)   Wt 196 lb (88.9 kg)   SpO2 98%   BMI 25.68 kg/m   BP Readings from Last 3 Encounters:  08/31/20 132/78  07/07/20 (!) 154/72  11/09/13 137/74    Wt Readings from Last 3 Encounters:  08/31/20 196 lb (88.9 kg)  07/07/20 193 lb 3.2 oz (87.6 kg)  11/09/13 198 lb (89.8 kg)    General appearance: alert, cooperative and appears stated age Ears: normal TM's and external ear canals both ears Throat: lips, mucosa, and tongue normal; teeth and gums normal Neck: no adenopathy, no carotid bruit, supple, symmetrical, trachea midline and thyroid not enlarged, symmetric, no tenderness/mass/nodules  Back: symmetric, no curvature. ROM normal. No CVA tenderness. Lungs: clear to auscultation bilaterally Heart: regular rate and rhythm, S1, S2 normal, no murmur, click, rub or gallop Abdomen: soft, non-tender; bowel sounds normal; no masses,  no organomegaly Pulses: 2+ and symmetric Skin: Skin color, texture, turgor normal. No rashes or lesions Lymph nodes: Cervical, supraclavicular, and  axillary nodes normal.  No results found for: HGBA1C  Lab Results  Component Value Date   CREATININE 0.94 08/31/2020   CREATININE 1.00 07/21/2020   CREATININE 1.0 05/08/2012    Lab Results  Component Value Date   WBC 4.9 08/31/2020   HGB 14.3 08/31/2020   HCT 42.4 08/31/2020   PLT 260.0 08/31/2020   GLUCOSE 88 08/31/2020   CHOL 216 (H) 08/31/2020   TRIG 67.0 08/31/2020   HDL 84.60 08/31/2020   LDLCALC 118 (H) 08/31/2020   ALT 25 08/31/2020   AST 22 08/31/2020   NA 138 08/31/2020   K 4.0 08/31/2020   CL 102 08/31/2020   CREATININE 0.94 08/31/2020   BUN 17 08/31/2020   CO2 28 08/31/2020   TSH 1.08 08/31/2020   PSA 1.48 05/08/2012    CT ABDOMEN PELVIS W WO CONTRAST  Result Date: 07/22/2020 CLINICAL DATA:  Prostate cancer staging EXAM: CT ABDOMEN AND PELVIS WITHOUT AND WITH CONTRAST TECHNIQUE: Multidetector CT imaging of the abdomen and pelvis was performed following the standard protocol before and following the bolus administration of intravenous contrast. CONTRAST:  160m OMNIPAQUE IOHEXOL 300 MG/ML  SOLN COMPARISON:  MR prostate 06/02/2020 FINDINGS: Lower chest: 2 mm left lower lobe pulmonary nodule, image 7 series 4. Hepatobiliary: Multiple hypodense hepatic lesions favor cysts, with several small hypodense lesions near the gallbladder fossa technically too small to characterize. Bilobed lesion in the right hepatic lobe measuring 4.6 by 4.2 cm on image 10 series 7 deviates adjacent portal venous structure and may have a single septation versus representing 2 adjacent cysts. No appreciable internal nodularity. Gallbladder unremarkable. Pancreas: Along the upper margin of the pancreatic head and in close proximity to the common bile duct, a 0.8 by 1.1 by 1.0 cm cystic lesion is present on image 23 series 7. I am skeptical that this represents a diverticulum from the CBD. This is probably either a postinflammatory cystic lesion of the pancreas or small intraductal papillary mucinous  neoplasm. Spleen: Unremarkable Adrenals/Urinary Tract: Unremarkable Stomach/Bowel: Prominent stool throughout the colon favors constipation. Vascular/Lymphatic: Aortoiliac atherosclerotic vascular disease. No pathologic adenopathy identified. Reproductive: Prostatomegaly. Other: No supplemental non-categorized findings. Musculoskeletal: Suspected hemangioma in the L3 vertebral body. No characteristic findings of metastatic prostate cancer to bone. Lumbar spondylosis and degenerative disc disease causing bilateral foraminal impingement at L3-4 and L4-5. IMPRESSION: 1. No specific findings of metastatic disease. A 2 mm left lower lobe pulmonary nodule is probably benign but may merit surveillance in this context. 2. 1.1 cm cystic lesion along the upper margin of the pancreatic head, possibly a postinflammatory lesion or intraductal papillary mucinous neoplasm. Follow up pancreatic protocol MRI or CT with and without contrast is recommended in 2 years time. This recommendation follows ACR consensus guidelines: Management of Incidental Pancreatic Cysts: A White Paper of the ACR Incidental Findings Committee. [id number] 3. Hepatic cysts noted. 4.  Prominent stool throughout the colon favors constipation. 5.  Aortic Atherosclerosis (ICD10-I70.0). 6. Lumbar impingement at L3-4 and L4-5. Electronically Signed   By: WVan ClinesM.D.   On: 07/22/2020 15:28   NM Bone Scan Whole Body  Result Date: 07/22/2020 CLINICAL  DATA:  Prostate cancer. EXAM: NUCLEAR MEDICINE WHOLE BODY BONE SCAN TECHNIQUE: Whole body anterior and posterior images were obtained approximately 3 hours after intravenous injection of radiopharmaceutical. RADIOPHARMACEUTICALS:  20.0 mCi Technetium-95mMDP IV COMPARISON:  CT abdomen 07/21/2020 FINDINGS: Degenerative activity at the thumb bases bilaterally. Mild degenerative activity in the thoracolumbar spine. Mild degenerative findings in the knees and AC joints. No compelling  evidence of metastatic disease to the skeleton. IMPRESSION: 1. No findings of metastatic disease to the skeleton. 2. Scattered degenerative findings as noted above. Electronically Signed   By: WVan ClinesM.D.   On: 07/22/2020 15:29    Assessment & Plan:   Problem List Items Addressed This Visit       Unprioritized   Fatigue   Relevant Orders   TSH (Completed)   Lipid panel (Completed)   Comprehensive metabolic panel (Completed)   CBC with Differential/Platelet (Completed)   Prostate cancer ([place]    Now diagnosed with prostate CA , Gleason score of 7 (4+3).  (Localized,  Unfavorable risk )  Currently receiving Orgovyx but insurance dictating a change to lupron,  With plans for Dr WBoneta Lucksto ablate with U/s  (per patient,  No records available) [facility]  Referral in progress.       Relevant Medications   LUPRON DEPOT, 48-MONTH, 45 MG injection   Relugolix (ORGOVYX PO)   Other Relevant Orders   Ambulatory referral to Urology   Abdominal aortic atherosclerosis ([place]    Incidental finding on Abd CT. Aortoiliac disease.  No claudication symptoms.  Lipids pending.  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    Dispense:  1 each    Refill:  1    Medications Discontinued During This Encounter  Medication Reason   levofloxacin (LEVAQUIN) 500 MG tablet Patient Preference    Follow-up: No follow-ups on file.   TCrecencio Mc MD

## 2020-08-31 NOTE — Assessment & Plan Note (Signed)
Incidental finding on Abd CT. Aortoiliac disease.  No claudication symptoms.  Lipids pending.  Sending to Serafina Royals for risk stratification

## 2020-08-31 NOTE — Assessment & Plan Note (Signed)
Referring to Samuel Barnett for further evaluation given the ddx

## 2020-08-31 NOTE — Progress Notes (Signed)
Pre visit review using our clinic review tool, if applicable. No additional management support is needed unless otherwise documented below in the visit note. 

## 2020-08-31 NOTE — Assessment & Plan Note (Addendum)
Now diagnosed with prostate CA , Gleason score of 7 (4+3).  (Localized,  Unfavorable risk )  Currently receiving Orgovyx but insurance dictating a change to lupron,  With plans for Dr Boneta Lucks to ablate with U/s  (per patient,  No records available) Recommend second opinion on treatment with Dr Hillis Range at [facility],  Referral in progress.

## 2020-08-31 NOTE — Patient Instructions (Addendum)
You received your Tdap booster today (good for ten years)   The ShingRx vaccine is now available in local pharmacies and is much more protective than Zostavaxs,  It is therefore ADVISED for all interested adults over 50 to prevent shingles.    Referral to Drs. Claudia Pollock,  Serafina Royals under way    Follow up on pancreatic cyst TBD

## 2020-09-03 DIAGNOSIS — E291 Testicular hypofunction: Secondary | ICD-10-CM | POA: Insufficient documentation

## 2020-09-03 DIAGNOSIS — Z79899 Other long term (current) drug therapy: Secondary | ICD-10-CM | POA: Insufficient documentation

## 2020-09-03 NOTE — Addendum Note (Signed)
Addended by: Crecencio Mc on: 09/03/2020 02:04 PM   Modules accepted: Orders

## 2020-09-04 LAB — SEX HORMONE BINDING GLOBULIN: Sex Hormone Binding: 59 nmol/L (ref 22–77)

## 2020-09-04 LAB — TESTOSTERONE, FREE: TESTOSTERONE FREE: 0.4 pg/mL — ABNORMAL LOW (ref 46.0–224.0)

## 2020-09-07 LAB — TESTOSTERONE, % FREE: Testosterone-% Free: 0.7 %

## 2020-09-29 ENCOUNTER — Other Ambulatory Visit: Payer: Self-pay | Admitting: Internal Medicine

## 2020-09-29 DIAGNOSIS — I25118 Atherosclerotic heart disease of native coronary artery with other forms of angina pectoris: Secondary | ICD-10-CM

## 2020-09-29 DIAGNOSIS — I208 Other forms of angina pectoris: Secondary | ICD-10-CM

## 2020-09-29 DIAGNOSIS — I2089 Other forms of angina pectoris: Secondary | ICD-10-CM

## 2020-10-06 ENCOUNTER — Encounter: Payer: BC Managed Care – PPO | Admitting: Adult Health

## 2020-10-19 ENCOUNTER — Telehealth (HOSPITAL_COMMUNITY): Payer: Self-pay | Admitting: Emergency Medicine

## 2020-10-19 DIAGNOSIS — I700 Atherosclerosis of aorta: Secondary | ICD-10-CM

## 2020-10-19 MED ORDER — METOPROLOL TARTRATE 100 MG PO TABS: 100.0000 mg | ORAL_TABLET | Freq: Once | ORAL | 0 refills | Status: DC

## 2020-10-19 NOTE — Telephone Encounter (Signed)
Calling to review patients instructions for upcoming CCTA.   Pt states hes currently with a head cold and wishes to r/s   New appt 11/3 at 10:15 at [facility]  I also informed him of our HR control requirements and prescribed a one time dose 100mg  metoprolol to his preferred pharmacy.  Marchia Bond RN Navigator Cardiac Imaging [facility] [phone] Office  [phone] Cell

## 2020-10-20 ENCOUNTER — Ambulatory Visit: Admission: RE | Admit: 2020-10-20 | Payer: BC Managed Care – PPO | Source: Ambulatory Visit

## 2020-11-02 ENCOUNTER — Telehealth (HOSPITAL_COMMUNITY): Payer: Self-pay | Admitting: Emergency Medicine

## 2020-11-02 NOTE — Telephone Encounter (Signed)
Reaching out to patient to offer assistance regarding upcoming cardiac imaging study; pt verbalizes understanding of appt date/time, parking situation and where to check in, pre-test NPO status and medications ordered, and verified current allergies; name and call back number provided for further questions should they arise Aero Drummonds RN Navigator Cardiac Imaging [place] [phone] office [phone] cell  100mg metoprolol tartrate Denies iv issues 

## 2020-11-03 ENCOUNTER — Other Ambulatory Visit: Payer: BC Managed Care – PPO

## 2020-11-03 ENCOUNTER — Ambulatory Visit: Admission: RE | Admit: 2020-11-03 | Payer: BC Managed Care – PPO | Source: Ambulatory Visit

## 2020-11-03 ENCOUNTER — Other Ambulatory Visit (HOSPITAL_COMMUNITY): Payer: Self-pay | Admitting: *Deleted

## 2020-11-03 DIAGNOSIS — I700 Atherosclerosis of aorta: Secondary | ICD-10-CM

## 2020-11-03 MED ORDER — METOPROLOL TARTRATE 100 MG PO TABS
100.0000 mg | ORAL_TABLET | Freq: Once | ORAL | 0 refills | Status: DC
Start: 2020-11-03 — End: 2021-12-09

## 2020-11-29 ENCOUNTER — Telehealth (HOSPITAL_COMMUNITY): Payer: Self-pay | Admitting: *Deleted

## 2020-11-29 NOTE — Telephone Encounter (Signed)
Reaching out to patient to offer assistance regarding upcoming cardiac imaging study; pt verbalizes understanding of appt date/time, parking situation and where to check in, pre-test NPO status and medications ordered, and verified current allergies; name and call back number provided for further questions should they arise  Gordy Clement RN Navigator Cardiac Imaging [facility] [phone] office [phone] cell  Patient aware to take 100mg  metoprolol tartrate two hours prior to cardiac CT scan.  He wishes to get an I-stat on day of test to check kidney function. Order for I-stat signed and held.

## 2020-12-01 ENCOUNTER — Other Ambulatory Visit: Payer: Self-pay

## 2020-12-01 ENCOUNTER — Ambulatory Visit
Admission: RE | Admit: 2020-12-01 | Discharge: 2020-12-01 | Disposition: A | Discharge status: Home / Self Care | Payer: BC Managed Care – PPO | Source: Ambulatory Visit | Attending: Internal Medicine | Admitting: Internal Medicine

## 2020-12-01 DIAGNOSIS — I208 Other forms of angina pectoris: Secondary | ICD-10-CM | POA: Insufficient documentation

## 2020-12-01 DIAGNOSIS — I25118 Atherosclerotic heart disease of native coronary artery with other forms of angina pectoris: Secondary | ICD-10-CM | POA: Insufficient documentation

## 2020-12-01 LAB — POCT I-STAT CREATININE: Creatinine, Ser: 1 mg/dL (ref 0.61–1.24)

## 2020-12-01 IMAGING — CT CT HEART MORP W/ CTA COR W/ SCORE W/ CA W/CM &/OR W/O CM
2 of 11 series · 7 of 20 positions shown, 8 images · non-contrast
Comparison: None.

Addendum:
CLINICAL DATA: Precordial pain

EXAM:
Cardiac/Coronary  CTA
TECHNIQUE: The patient was scanned on a Siemens Somatom go.Top scanner.

[Series 25: multiphase % cta coronary 0.60 · axial · 0.34mm/px · z∈[-1191,-1103]mm · 4 of 4081 slices shown, 5 images]
[im 817/4081  vessel]
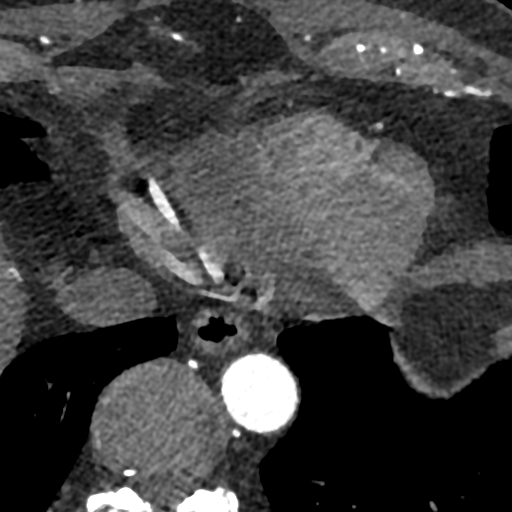
[im 817/4081  lung]
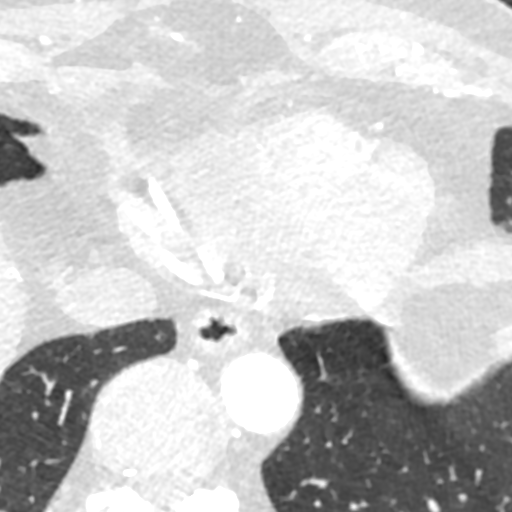
[im 1633/4081  vessel]
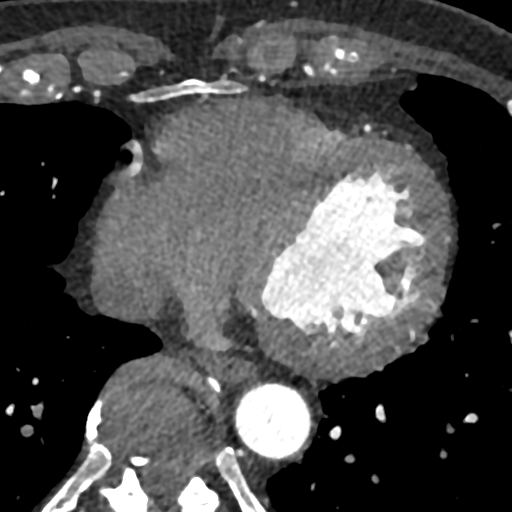
[im 2449/4081  vessel]
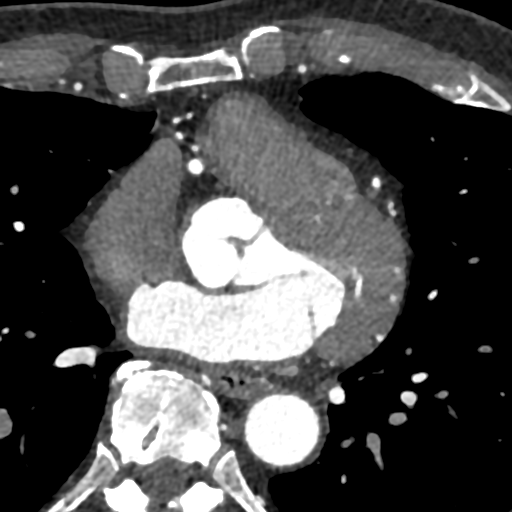
[im 3265/4081  vessel]
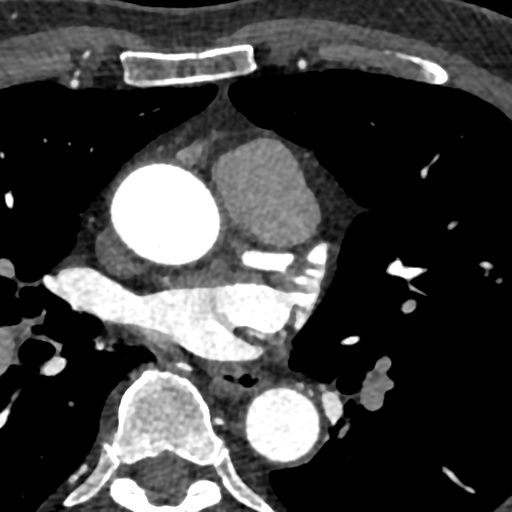

[Series 38: ms multiphase cta coronary 0.60 · axial · 0.34mm/px · z∈[-1184,-1110]mm · 3 of 3339 slices shown]
[im 835/3339  vessel]
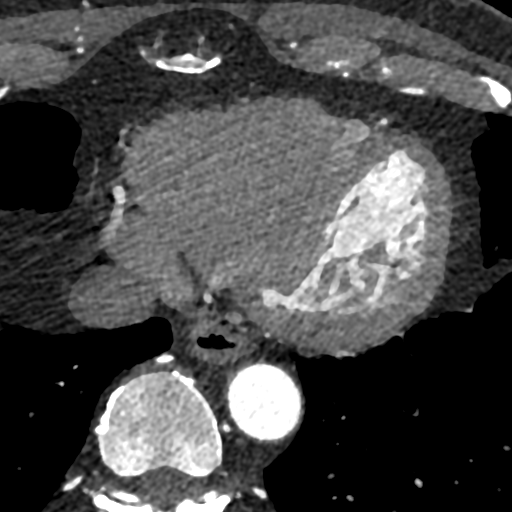
[im 1670/3339  vessel]
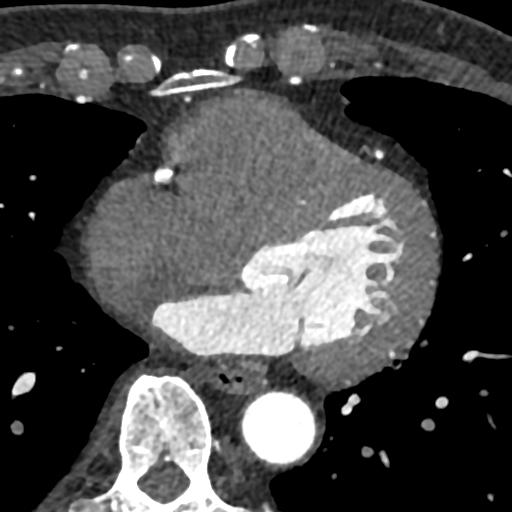
[im 2504/3339  vessel]
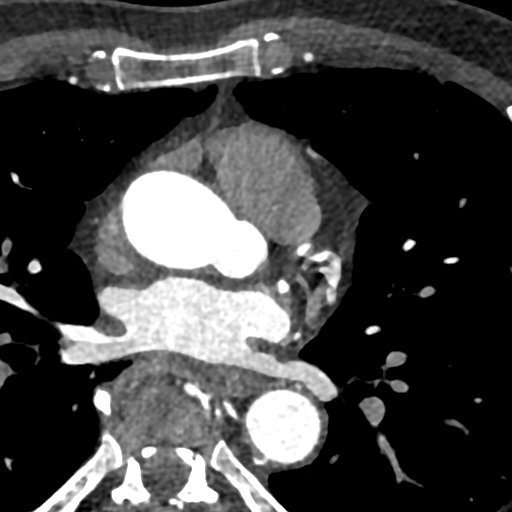

[7 of 20 positions shown; findings below may reference images not displayed]



Aortic Valve:  Trileaflet.  No calcifications.

Coronary Arteries:  Normal coronary origin.  Right dominance.

RCA is a dominant artery that gives rise to PDA and PLA. There is no
plaque.

Left main is a large artery that gives rise to LAD and LCX arteries.
There is no LM disease.

LAD has no plaque.

LCX is a non-dominant artery that gives rise to two obtuse marginal
branches. There is no plaque.

Other findings:

Normal pulmonary vein drainage into the left atrium.

Normal left atrial appendage without a thrombus.

Normal size of the pulmonary artery.
IMPRESSION: 1. Normal coronary calcium score of 0. Patient is low risk for
coronary events.

2. Normal coronary origin with right dominance.

3. No evidence of CAD.

4. CAD-RADS 0. Consider non-atherosclerotic causes of chest pain.

EXAM:
OVER-READ INTERPRETATION  CT CHEST

The following report is an over-read performed by radiologist Dr.
does not include interpretation of cardiac or coronary anatomy or
pathology. The coronary calcium score/coronary CTA interpretation by
the cardiologist is attached.
FINDINGS: No suspicious nodules, masses, or infiltrates are identified in the
visualized portion of the lungs. No pleural fluid seen.

The visualized portions of the mediastinum and chest wall are
unremarkable. Multiple fluid attenuation hepatic cysts are
incidentally noted.
IMPRESSION: No significant non-cardiac abnormality in visualized portion of the
thorax.



Aortic Valve:  Trileaflet.  No calcifications.

Coronary Arteries:  Normal coronary origin.  Right dominance.

RCA is a dominant artery that gives rise to PDA and PLA. There is no
plaque.

Left main is a large artery that gives rise to LAD and LCX arteries.
There is no LM disease.

LAD has no plaque.

LCX is a non-dominant artery that gives rise to two obtuse marginal
branches. There is no plaque.

Other findings:

Normal pulmonary vein drainage into the left atrium.

Normal left atrial appendage without a thrombus.

Normal size of the pulmonary artery.
IMPRESSION: 1. Normal coronary calcium score of 0. Patient is low risk for
coronary events.

2. Normal coronary origin with right dominance.

3. No evidence of CAD.

4. CAD-RADS 0. Consider non-atherosclerotic causes of chest pain.

## 2020-12-01 MED ORDER — NITROGLYCERIN 0.4 MG SL SUBL
0.8000 mg | SUBLINGUAL_TABLET | Freq: Once | SUBLINGUAL | Status: AC
  Administered 2020-12-01: 0.8 mg via SUBLINGUAL

## 2020-12-01 MED ORDER — IOHEXOL 350 MG/ML SOLN
75.0000 mL | Freq: Once | INTRAVENOUS | Status: AC | PRN
  Administered 2020-12-01: 75 mL via INTRAVENOUS

## 2021-03-01 DIAGNOSIS — C61 Malignant neoplasm of prostate: Secondary | ICD-10-CM

## 2021-03-01 HISTORY — DX: Malignant neoplasm of prostate: C61

## 2021-03-20 ENCOUNTER — Encounter: Payer: Self-pay | Admitting: Dermatology

## 2021-04-20 ENCOUNTER — Ambulatory Visit (INDEPENDENT_AMBULATORY_CARE_PROVIDER_SITE_OTHER): Payer: No Typology Code available for payment source | Admitting: Dermatology

## 2021-04-20 DIAGNOSIS — Z86018 Personal history of other benign neoplasm: Secondary | ICD-10-CM

## 2021-04-20 DIAGNOSIS — L918 Other hypertrophic disorders of the skin: Secondary | ICD-10-CM | POA: Diagnosis not present

## 2021-04-20 DIAGNOSIS — Z1283 Encounter for screening for malignant neoplasm of skin: Secondary | ICD-10-CM

## 2021-04-20 DIAGNOSIS — L578 Other skin changes due to chronic exposure to nonionizing radiation: Secondary | ICD-10-CM

## 2021-04-20 DIAGNOSIS — D229 Melanocytic nevi, unspecified: Secondary | ICD-10-CM

## 2021-04-20 DIAGNOSIS — L814 Other melanin hyperpigmentation: Secondary | ICD-10-CM

## 2021-04-20 DIAGNOSIS — W57XXXA Bitten or stung by nonvenomous insect and other nonvenomous arthropods, initial encounter: Secondary | ICD-10-CM

## 2021-04-20 DIAGNOSIS — D1800 Hemangioma unspecified site: Secondary | ICD-10-CM

## 2021-04-20 DIAGNOSIS — L821 Other seborrheic keratosis: Secondary | ICD-10-CM

## 2021-04-20 NOTE — Patient Instructions (Addendum)
Recommended Sunscreen ?Elta MD Sunscreen Clear, Neutrogena , Cerave  ? ?Recommend [place] Off for mosquito repellent - apply to clothing  ? ? ? ?Seborrheic Keratosis ? ?What causes seborrheic keratoses? ?Seborrheic keratoses are harmless, common skin growths that first appear during adult life.  As time goes by, more growths appear.  Some people may develop a large number of them.  Seborrheic keratoses appear on both covered and uncovered body parts.  They are not caused by sunlight.  The tendency to develop seborrheic keratoses can be inherited.  They vary in color from skin-colored to gray, brown, or even black.  They can be either smooth or have a rough, warty surface.   ?Seborrheic keratoses are superficial and look as if they were stuck on the skin.  Under the microscope this type of keratosis looks like layers upon layers of skin.  That is why at times the top layer may seem to fall off, but the rest of the growth remains and re-grows.   ? ?Treatment ?Seborrheic keratoses do not need to be treated, but can easily be removed in the office.  Seborrheic keratoses often cause symptoms when they rub on clothing or jewelry.  Lesions can be in the way of shaving.  If they become inflamed, they can cause itching, soreness, or burning.  Removal of a seborrheic keratosis can be accomplished by freezing, burning, or surgery. ?If any spot bleeds, scabs, or grows rapidly, please return to have it checked, as these can be an indication of a skin cancer. ? ? ? ?Melanoma ABCDEs ? ?Melanoma is the most dangerous type of skin cancer, and is the leading cause of death from skin disease.  You are more likely to develop melanoma if you: ?Have light-colored skin, light-colored eyes, or red or blond hair ?Spend a lot of time in the sun ?Tan regularly, either outdoors or in a tanning bed ?Have had blistering sunburns, especially during childhood ?Have a close family member who has had a melanoma ?Have atypical moles or large  birthmarks ? ?Early detection of melanoma is key since treatment is typically straightforward and cure rates are extremely high if we catch it early.  ? ?The first sign of melanoma is often a change in a mole or a new dark spot.  The ABCDE system is a way of remembering the signs of melanoma. ? ?A for asymmetry:  The two halves do not match. ?B for border:  The edges of the growth are irregular. ?C for color:  A mixture of colors are present instead of an even brown color. ?D for diameter:  Melanomas are usually (but not always) greater than 9m - the size of a pencil eraser. ?E for evolution:  The spot keeps changing in size, shape, and color. ? ?Please check your skin once per month between visits. You can use a small mirror in front and a large mirror behind you to keep an eye on the back side or your body.  ? ?If you see any new or changing lesions before your next follow-up, please call to schedule a visit. ? ?Please continue daily skin protection including broad spectrum sunscreen SPF 30+ to sun-exposed areas, reapplying every 2 hours as needed when you're outdoors.  ? ?Staying in the shade or wearing long sleeves, sun glasses (UVA+UVB protection) and wide brim hats (4-inch brim around the entire circumference of the hat) are also recommended for sun protection.   ? ?If You Need Anything After Your Visit ? ?If you  have any questions or concerns for your doctor, please call our main line at [phone] and press option 4 to reach your doctor's medical assistant. If no one answers, please leave a voicemail as directed and we will return your call as soon as possible. Messages left after 4 pm will be answered the following business day.  ? ?You may also send [place] a message via MyChart. We typically respond to MyChart messages within 1-2 business days. ? ?For prescription refills, please ask your pharmacy to contact our office. Our fax number is [phone]. ? ?If you have an urgent issue when the clinic is closed  that cannot wait until the next business day, you can page your doctor at the number below.   ? ?Please note that while we do our best to be available for urgent issues outside of office hours, we are not available 24/7.  ? ?If you have an urgent issue and are unable to reach [place], you may choose to seek medical care at your doctor's office, retail clinic, urgent care center, or emergency room. ? ?If you have a medical emergency, please immediately call 911 or go to the emergency department. ? ?Pager Numbers ? ?- Dr. Nehemiah Massed: [phone] ? ?- Dr. Laurence Ferrari: [phone] ? ?- Dr. Nicole Kindred: [phone] ? ?In the event of inclement weather, please call our main line at [phone] for an update on the status of any delays or closures. ? ?Dermatology Medication Tips: ?Please keep the boxes that topical medications come in in order to help keep track of the instructions about where and how to use these. Pharmacies typically print the medication instructions only on the boxes and not directly on the medication tubes.  ? ?If your medication is too expensive, please contact our office at [phone] option 4 or send [place] a message through [employer].  ? ?We are unable to tell what your co-pay for medications will be in advance as this is different depending on your insurance coverage. However, we may be able to find a substitute medication at lower cost or fill out paperwork to get insurance to cover a needed medication.  ? ?If a prior authorization is required to get your medication covered by your insurance company, please allow [place] 1-2 business days to complete this process. ? ?Drug prices often vary depending on where the prescription is filled and some pharmacies may offer cheaper prices. ? ?The website www.goodrx.com contains coupons for medications through different pharmacies. The prices here do not account for what the cost may be with help from insurance (it may be cheaper with your insurance), but the website can give you  the price if you did not use any insurance.  ?- You can print the associated coupon and take it with your prescription to the pharmacy.  ?- You may also stop by our office during regular business hours and pick up a GoodRx coupon card.  ?- If you need your prescription sent electronically to a different pharmacy, notify our office through [facility] or by phone at [phone] option 4. ? ? ? ? ?Si Usted Necesita Algo Despu?s de Su Visita ? ?Tambi?n puede enviarnos un mensaje a trav?s de MyChart. Por lo general respondemos a los mensajes de MyChart en el transcurso de 1 a 2 d?as h?biles. ? ?Para renovar recetas, por favor pida a su farmacia que se ponga en contacto con nuestra oficina. Nuestro n?mero de fax es el [phone]. ? ?Si tiene un asunto urgente cuando la cl?nica est? cerrada y  que no puede esperar hasta el siguiente d?a h?bil, puede llamar/localizar a su doctor(a) al n?mero que aparece a continuaci?n.  ? ?Por favor, tenga en cuenta que aunque hacemos todo lo posible para estar disponibles para asuntos urgentes fuera del horario de oficina, no estamos disponibles las 24 horas del d?a, los 7 d?as de la semana.  ? ?Si tiene un problema urgente y no puede comunicarse con nosotros, puede optar por buscar atenci?n m?dica  en el consultorio de su doctor(a), en una cl?nica privada, en un centro de atenci?n urgente o en una sala de emergencias. ? ?Si tiene Engineer, maintenance (IT) m?dica, por favor llame inmediatamente al 911 o vaya a la sala de emergencias. ? ?N?meros de b?per ? ?- Dr. Nehemiah Massed: [phone] ? ?- Dra. Moye: [phone] ? ?- Dra. Nicole Kindred: [phone] ? ?En caso de inclemencias del tiempo, por favor llame a nuestra l?nea principal al [phone] para una actualizaci?n sobre el estado de cualquier retraso o cierre. ? ?Consejos para la medicaci?n en dermatolog?a: ?Por favor, guarde las cajas en las que vienen los medicamentos de uso t?pico para ayudarle a seguir las instrucciones sobre d?nde y  c?mo usarlos. Las farmacias generalmente imprimen las instrucciones del medicamento s?lo en las cajas y no directamente en los tubos del [place].  ? ?Si su medicamento es muy caro, por favor, p?ngase en

## 2021-04-20 NOTE — Progress Notes (Signed)
? ?  Follow-Up Visit ?  ?Subjective  ?Samuel Barnett is a 66 y.o. male who presents for the following: Annual Exam (Hx of dysplastic nevi discuss a good sunscreen ). ?The patient presents for Total-Body Skin Exam (TBSE) for skin cancer screening and mole check.  The patient has spots, moles and lesions to be evaluated, some may be new or changing and the patient has concerns that these could be cancer. ? ?The following portions of the chart were reviewed this encounter and updated as appropriate:  Tobacco  Allergies  Meds  Problems  Med Hx  Surg Hx  Fam Hx   ?  ?Review of Systems: No other skin or systemic complaints except as noted in HPI or Assessment and Plan. ? ?Objective  ?Well appearing patient in no apparent distress; mood and affect are within normal limits. ? ?A full examination was performed including scalp, head, eyes, ears, nose, lips, neck, chest, axillae, abdomen, back, buttocks, bilateral upper extremities, bilateral lower extremities, hands, feet, fingers, toes, fingernails, and toenails. All findings within normal limits unless otherwise noted below. ? ? 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 ?Lentigines ?- Scattered tan macules ?- Due to sun exposure ?- Benign-appearing, observe ?- Recommend daily broad spectrum sunscreen SPF 30+ to sun-exposed areas, reapply every 2 hours as needed. ?- Call for any changes ? ?Acrochordons (Skin Tags) ?- Fleshy, skin-colored pedunculated papules ?- Benign appearing.  ?- Observe. ?- If desired, they can be removed with an in office procedure that is not covered by insurance. ?- Please call the clinic if you notice any new or changing lesions. ? ?Seborrheic Keratoses ?- Stuck-on, waxy, tan-brown papules and/or plaques  ?- Benign-appearing ?- Discussed benign etiology and prognosis. ?- Observe ?- Call for any  changes ? ?Melanocytic Nevi ?- Tan-brown and/or pink-flesh-colored symmetric macules and papules ?- Benign appearing on exam today ?- Observation ?- Call clinic for new or changing moles ?- Recommend daily use of broad spectrum spf 30+ sunscreen to sun-exposed areas.  ? ?Hemangiomas ?- Red papules ?- Discussed benign nature ?- Observe ?- Call for any changes ? 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  ?- Staying in the shade or wearing long sleeves, sun glasses (UVA+UVB protection) and wide brim hats (4-inch brim around the entire circumference of the hat) are also recommended for sun protection.  ?- Call for new or changing lesions. ? ?History of Dysplastic Nevi ?- No evidence of recurrence today at left sup pectoral 2016 ?- Recommend regular full body skin exams ?- Recommend daily broad spectrum sunscreen SPF 30+ to sun-exposed areas, reapply every 2 hours as needed.  ?- Call if any new or changing lesions are noted between office visits ? ?Skin cancer screening performed today. ? ?Return in about 1 year (around 04/21/2022) for TBSE. ?I, Ruthell Rummage, CMA, am acting as scribe for Sarina Ser, MD. ?Documentation: I have reviewed the above documentation for accuracy and completeness, and I agree with the above. ? ?Sarina Ser, MD ? ?

## 2021-04-29 ENCOUNTER — Encounter: Payer: Self-pay | Admitting: Dermatology

## 2021-12-07 ENCOUNTER — Encounter: Payer: Self-pay | Admitting: Internal Medicine

## 2021-12-07 ENCOUNTER — Ambulatory Visit: Payer: No Typology Code available for payment source | Admitting: Internal Medicine

## 2021-12-07 VITALS — BP 128/80 | HR 71 | Temp 98.2°F | Ht 73.5 in | Wt 210.4 lb

## 2021-12-07 DIAGNOSIS — R7301 Impaired fasting glucose: Secondary | ICD-10-CM | POA: Diagnosis not present

## 2021-12-07 DIAGNOSIS — T50905A Adverse effect of unspecified drugs, medicaments and biological substances, initial encounter: Secondary | ICD-10-CM

## 2021-12-07 DIAGNOSIS — Z23 Encounter for immunization: Secondary | ICD-10-CM | POA: Diagnosis not present

## 2021-12-07 DIAGNOSIS — Z1159 Encounter for screening for other viral diseases: Secondary | ICD-10-CM

## 2021-12-07 DIAGNOSIS — C61 Malignant neoplasm of prostate: Secondary | ICD-10-CM | POA: Diagnosis not present

## 2021-12-07 DIAGNOSIS — R5383 Other fatigue: Secondary | ICD-10-CM

## 2021-12-07 DIAGNOSIS — Z1211 Encounter for screening for malignant neoplasm of colon: Secondary | ICD-10-CM | POA: Diagnosis not present

## 2021-12-07 DIAGNOSIS — E785 Hyperlipidemia, unspecified: Secondary | ICD-10-CM

## 2021-12-07 DIAGNOSIS — R635 Abnormal weight gain: Secondary | ICD-10-CM

## 2021-12-07 DIAGNOSIS — Z79899 Other long term (current) drug therapy: Secondary | ICD-10-CM

## 2021-12-07 DIAGNOSIS — Z0000 Encounter for general adult medical examination without abnormal findings: Secondary | ICD-10-CM

## 2021-12-07 LAB — COMPREHENSIVE METABOLIC PANEL
ALT: 16 U/L (ref 0–53)
AST: 16 U/L (ref 0–37)
Albumin: 4.6 g/dL (ref 3.5–5.2)
Alkaline Phosphatase: 65 U/L (ref 39–117)
BUN: 16 mg/dL (ref 6–23)
CO2: 30 mEq/L (ref 19–32)
Calcium: 9.3 mg/dL (ref 8.4–10.5)
Chloride: 103 mEq/L (ref 96–112)
Creatinine, Ser: 0.87 mg/dL (ref 0.40–1.50)
GFR: 89.82 mL/min (ref >60.00)
Glucose, Bld: 97 mg/dL (ref 70–99)
Potassium: 3.9 mEq/L (ref 3.5–5.1)
Sodium: 139 mEq/L (ref 135–145)
Total Bilirubin: 0.4 mg/dL (ref 0.2–1.2)
Total Protein: 6.9 g/dL (ref 6.0–8.3)

## 2021-12-07 LAB — CBC WITH DIFFERENTIAL/PLATELET
Basophils Absolute: 0.1 10*3/uL (ref 0.0–0.1)
Basophils Relative: 1.3 % (ref 0.0–3.0)
Eosinophils Absolute: 0.1 10*3/uL (ref 0.0–0.7)
Eosinophils Relative: 2.1 % (ref 0.0–5.0)
HCT: 40.3 % (ref 39.0–52.0)
Hemoglobin: 13.6 g/dL (ref 13.0–17.0)
Lymphocytes Relative: 39.6 % (ref 12.0–46.0)
Lymphs Abs: 2.2 10*3/uL (ref 0.7–4.0)
MCHC: 33.9 g/dL (ref 30.0–36.0)
MCV: 94.8 fl (ref 78.0–100.0)
Monocytes Absolute: 0.5 10*3/uL (ref 0.1–1.0)
Monocytes Relative: 8.3 % (ref 3.0–12.0)
Neutro Abs: 2.7 10*3/uL (ref 1.4–7.7)
Neutrophils Relative %: 48.7 % (ref 43.0–77.0)
Platelets: 294 10*3/uL (ref 150.0–400.0)
RBC: 4.24 Mil/uL (ref 4.22–5.81)
RDW: 14 % (ref 11.5–15.5)
WBC: 5.6 10*3/uL (ref 4.0–10.5)

## 2021-12-07 LAB — MICROALBUMIN / CREATININE URINE RATIO
Creatinine,U: 54.3 mg/dL
Microalb Creat Ratio: 1.3 mg/g (ref 0.0–30.0)
Microalb, Ur: <0.7 mg/dL (ref 0.0–1.9)

## 2021-12-07 LAB — TSH: TSH: 1.28 u[IU]/mL (ref 0.35–5.50)

## 2021-12-07 LAB — LIPID PANEL
Cholesterol: 221 mg/dL — ABNORMAL HIGH (ref 0–200)
HDL: 77.7 mg/dL (ref >39.00)
LDL Cholesterol: 124 mg/dL — ABNORMAL HIGH (ref 0–99)
NonHDL: 142.95
Total CHOL/HDL Ratio: 3
Triglycerides: 93 mg/dL (ref 0.0–149.0)
VLDL: 18.6 mg/dL (ref 0.0–40.0)

## 2021-12-07 LAB — HEMOGLOBIN A1C: Hgb A1c MFr Bld: 5.9 % (ref 4.6–6.5)

## 2021-12-07 LAB — LDL CHOLESTEROL, DIRECT: Direct LDL: 127 mg/dL

## 2021-12-07 MED ORDER — SILDENAFIL CITRATE 50 MG PO TABS: 50.0000 mg | ORAL_TABLET | Freq: Every day | ORAL | 11 refills | Status: DC | PRN

## 2021-12-07 MED ORDER — PREDNISONE 10 MG PO TABS: ORAL_TABLET | ORAL | 0 refills | Status: DC

## 2021-12-07 NOTE — Progress Notes (Signed)
The patient is here for annual preventive examination and management of other chronic and acute problems. Last seen August 2022  The risk factors are reflected in the social history.   The roster of all physicians providing medical care to patient - is listed in the Snapshot section of the chart.   Activities of daily living:  The patient is 100% independent in all ADLs: dressing, toileting, feeding as well as independent mobility   Home safety : The patient has smoke detectors in the home. They wear seatbelts.  There are no unsecured firearms at home. There is no violence in the home.    There is no risks for hepatitis, STDs or HIV. There is no   history of blood transfusion. They have no travel history to infectious disease endemic areas of the world.   The patient has seen their dentist in the last six month. They have seen their eye doctor in the last year. The patinet  denies slight hearing difficulty with regard to whispered voices and some television programs.  They have deferred audiologic testing in the last year.  They do not  have excessive sun exposure. Discussed the need for sun protection: hats, long sleeves and use of sunscreen if there is significant sun exposure.    Diet: the importance of a healthy diet is discussed. They do have a healthy diet.   The benefits of regular aerobic exercise were discussed. The patient  exercises  3 to 5 days per week  for  60 minutes.    Depression screen: there are no signs or vegative symptoms of depression- irritability, change in appetite, anhedonia, sadness/tearfullness.   The following portions of the patient's history were reviewed and updated as appropriate: allergies, current medications, past family history, past medical history,  past surgical history, past social history  and problem list.   Visual acuity was not assessed per patient preference since the patient has regular follow up with an  ophthalmologist. Hearing and body mass  index were assessed and reviewed.    During the course of the visit the patient was educated and counseled about appropriate screening and preventive services including : fall prevention , diabetes screening, nutrition counseling, colorectal cancer screening, and recommended immunizations.    Chief Complaint:  1) Prostae CA : the Hormone therapy  he has been taking post  HIFU in February 2023  in nearly intolerable: he has developed generalized muscle weakness,  gonadal atrophy,  pot belly,  and hot flashes. Weight gain of 10 lbs.   Last dose in March .  Dr Eliberto Ivory is retiring in March.     2) Right index finger has become painful , swollen, and has developing a trigger finger ,  started  this  summer   3) Aortic atherosclerosis noted on July 2022 CT .  He had a Cardiac CT done earlier this year noted a score of zero.   Review of Symptoms  Patient denies headache, fevers, malaise, unintentional weight loss, skin rash, eye pain, sinus congestion and sinus pain, sore throat, dysphagia,  hemoptysis , cough, dyspnea, wheezing, chest pain, palpitations, orthopnea, edema, abdominal pain, nausea, melena, diarrhea, constipation, flank pain, dysuria, hematuria, urinary  Frequency, nocturia, numbness, tingling, seizures,  Focal weakness, Loss of consciousness,  Tremor, insomnia, depression, anxiety, and suicidal ideation.    Physical Exam:  BP 128/80   Pulse 71   Temp 98.2 F (36.8 C) (Oral)   Ht 6' 1.5" (1.867 m)   Wt 210 lb 6.4 oz (  95.4 kg)   SpO2 98%   BMI 27.38 kg/m    General appearance: alert, cooperative and appears stated age Ears: normal TM's and external ear canals both ears Throat: lips, mucosa, and tongue normal; teeth and gums normal Neck: no adenopathy, no carotid bruit, supple, symmetrical, trachea midline and thyroid not enlarged, symmetric, no tenderness/mass/nodules Back: symmetric, no curvature. ROM normal. No CVA tenderness. Lungs: clear to auscultation bilaterally Heart:  regular rate and rhythm, S1, S2 normal, no murmur, click, rub or gallop Abdomen: soft, non-tender; bowel sounds normal; no masses,  no organomegaly Pulses: 2+ and symmetric Skin: Skin color, texture, turgor normal. No rashes or lesions Lymph nodes: Cervical, supraclavicular, and axillary nodes normal.   Assessment and Plan:  Prostate cancer [facility]) Treated with HIFU done in [place] Feb 2023,  followed by androgen deprivation,  last dose of Elegard was in  March.  PSAs due quarterly ,  nex once due in  February 2024  Long-term current use of high risk medication other than anticoagulant He is taking leuprolide inections every 6 months .  Will need DEXA scan following completion of treatment   Routine general medical examination at a health care facility age appropriate education and counseling updated, referrals for preventative services and immunizations addressed, dietary and smoking counseling addressed, most recent labs reviewed.  I have personally reviewed and have noted:   1) the patient's medical and social history 2) The pt's use of alcohol, tobacco, and illicit drugs 3) The patient's current medications and supplements 4) Functional ability including ADL's, fall risk, home safety risk, hearing and visual impairment 5) Diet and physical activities 6) Evidence for depression or mood disorder 7) The patient's height, weight, and BMI have been recorded in the chart  I have made referrals, and provided counseling and education based on review of the above   Weight gain due to medication Nutritional counselling given,.  Advised to increase protein intake and follow a low GI  Mediteranean based  diet    Updated Medication List Outpatient Encounter Medications as of 12/07/2021  Medication Sig   Misc Natural Products (CHOLESTEROL SUPPORT PO) Take by mouth.   Nutritional Supplements (JUICE PLUS FIBRE PO) Take 6 capsules by mouth daily.   OVER THE COUNTER MEDICATION Take 2 capsules by  mouth daily. Immune Defense   predniSONE (DELTASONE) 10 MG tablet 6 tablets daily for 3 days, then reduce by 1 tablet daily until gone   sildenafil (VIAGRA) 50 MG tablet Take 1 tablet (50 mg total) by mouth daily as needed for erectile dysfunction.   [DISCONTINUED] LUPRON DEPOT, 39-MONTH, 45 MG injection Inject into the muscle as directed.   [DISCONTINUED] Relugolix (ORGOVYX PO) Take by mouth.   [DISCONTINUED] metoprolol tartrate (LOPRESSOR) 100 MG tablet Take 1 tablet (100 mg total) by mouth once for 1 dose. Please take one time dose '100mg'$  metoprolol tartrate 2 hr prior to cardiac CT for HR control IF HR >55bpm.   No facility-administered encounter medications on file as of 12/07/2021.

## 2021-12-07 NOTE — Assessment & Plan Note (Addendum)
Treated with HIFU done in [facility] Feb 2023,  followed by androgen deprivation,  last dose of Elegard was in  March.  PSAs due quarterly ,  nex once due in  February 2024

## 2021-12-07 NOTE — Patient Instructions (Addendum)
Referral for colonoscopy to [place] due in August 2024,  referral in process  DEXA scan next summer     Referral to hand surgeon for index trigger finger   We will repeat PSA in Feb at [place] might want to try a premixed protein drink called Premier Protein shake for breakfast or late night snack . It is great tasting,   very low sugar and available everywhere   Nutritional analysis :  160 cal  30 g protein  1 g sugar 50% calcium needs   Cell phone [phone]  for prednisone effect

## 2021-12-08 LAB — HEPATITIS C ANTIBODY: Hepatitis C Ab: NONREACTIVE

## 2021-12-09 DIAGNOSIS — T50905A Adverse effect of unspecified drugs, medicaments and biological substances, initial encounter: Secondary | ICD-10-CM | POA: Insufficient documentation

## 2021-12-09 NOTE — Assessment & Plan Note (Signed)

## 2021-12-09 NOTE — Assessment & Plan Note (Signed)
He is taking leuprolide inections every 6 months .  Will need DEXA scan following completion of treatment

## 2021-12-09 NOTE — Assessment & Plan Note (Signed)
Nutritional counselling given,.  Advised to increase protein intake and follow a low GI  Mediteranean based  diet

## 2022-01-11 ENCOUNTER — Other Ambulatory Visit: Payer: Self-pay | Admitting: Internal Medicine

## 2022-01-11 MED ORDER — PREDNISONE 10 MG PO TABS: ORAL_TABLET | ORAL | 0 refills | Status: DC

## 2022-01-24 ENCOUNTER — Other Ambulatory Visit: Payer: Self-pay | Admitting: Internal Medicine

## 2022-01-24 DIAGNOSIS — C61 Malignant neoplasm of prostate: Secondary | ICD-10-CM

## 2022-02-13 ENCOUNTER — Other Ambulatory Visit (INDEPENDENT_AMBULATORY_CARE_PROVIDER_SITE_OTHER): Payer: No Typology Code available for payment source

## 2022-02-13 DIAGNOSIS — C61 Malignant neoplasm of prostate: Secondary | ICD-10-CM | POA: Diagnosis not present

## 2022-02-14 LAB — PSA: PSA: 0.32 ng/mL (ref 0.10–4.00)

## 2022-02-16 ENCOUNTER — Other Ambulatory Visit: Payer: Self-pay | Admitting: Internal Medicine

## 2022-02-16 DIAGNOSIS — C61 Malignant neoplasm of prostate: Secondary | ICD-10-CM

## 2022-02-16 NOTE — Assessment & Plan Note (Signed)
REPEAT PSA IS NO LONGER SUPPRESSED.  REFERRING TO DR Darcella Gasman IN [facility] per patient request (Dr Yves Dill has retired)

## 2022-04-17 ENCOUNTER — Other Ambulatory Visit: Payer: Self-pay | Admitting: Internal Medicine

## 2022-04-17 ENCOUNTER — Telehealth: Payer: Self-pay | Admitting: Internal Medicine

## 2022-04-17 DIAGNOSIS — C61 Malignant neoplasm of prostate: Secondary | ICD-10-CM

## 2022-04-17 DIAGNOSIS — Z1211 Encounter for screening for malignant neoplasm of colon: Secondary | ICD-10-CM

## 2022-04-17 NOTE — Telephone Encounter (Signed)
Patient needs screening colonoscopy hopefully within the next couple of weeks.  He needs to start radiation for recurrent prostate cancer so we want to try to expedite this.  I was contacted by his PCP Dr. Darrick Huntsman.  Please contact the patient and try to arrange hopefully there is a previsit slot that we can work him into and get this done within the next couple of weeks.

## 2022-04-17 NOTE — Assessment & Plan Note (Addendum)
Recurrent, heralded by rise in PSA from undetectable to 0.32 in Feb. He has been seen by [facility] ;  treatment  with XRT planned at [facility] but needs to have his colonoscopy first.  Urgent GI referral made to Dr Alphia Kava al.   Lab Results  Component Value Date   PSA 0.32 02/13/2022   PSA 1.48 05/08/2012

## 2022-04-18 ENCOUNTER — Other Ambulatory Visit: Payer: Self-pay | Admitting: Radiation Oncology

## 2022-04-18 DIAGNOSIS — C61 Malignant neoplasm of prostate: Secondary | ICD-10-CM

## 2022-04-18 NOTE — Telephone Encounter (Signed)
Shanda Bumps can you please work on getting this pt set up for previsit and colon with Dr Leone Payor?  Thank you for your help

## 2022-04-18 NOTE — Telephone Encounter (Signed)
Scheduled for 04/25/22

## 2022-04-20 ENCOUNTER — Encounter: Payer: Self-pay | Admitting: Internal Medicine

## 2022-04-20 ENCOUNTER — Ambulatory Visit (AMBULATORY_SURGERY_CENTER): Payer: No Typology Code available for payment source

## 2022-04-20 VITALS — Ht 73.5 in | Wt 204.0 lb

## 2022-04-20 DIAGNOSIS — Z1211 Encounter for screening for malignant neoplasm of colon: Secondary | ICD-10-CM

## 2022-04-20 MED ORDER — NA SULFATE-K SULFATE-MG SULF 17.5-3.13-1.6 GM/177ML PO SOLN: 1.0000 | Freq: Once | ORAL | 0 refills | Status: AC

## 2022-04-20 NOTE — Progress Notes (Signed)
Pre visit completed via phone call; Patient verified name, DOB, and address;  No egg or soy allergy known to patient;  No issues known to pt with past sedation with any surgeries or procedures; Patient denies ever being told they had issues or difficulty with intubation;  No FH of Malignant Hyperthermia; Pt is not on diet pills; Pt is not on home 02; Pt is not on blood thinners;  Pt reports issues with constipation - patient reports sporadic episodes with constipation-takes Miralax and/or Dulcolax for assistance with issue; No A fib or A flutter; Have any cardiac testing pending--NO Pt instructed to use Singlecare.com or GoodRx for a price reduction on prep;   Insurance verified during PV appt=[employer]  Patient's chart reviewed by Cathlyn Parsons CNRA prior to previsit and patient appropriate for the [facility].  Previsit completed and red dot placed by patient's name on their procedure day (on provider's schedule).

## 2022-04-24 ENCOUNTER — Encounter: Payer: Self-pay | Admitting: Certified Registered Nurse Anesthetist

## 2022-04-25 ENCOUNTER — Ambulatory Visit: Payer: No Typology Code available for payment source | Admitting: Internal Medicine

## 2022-04-25 ENCOUNTER — Encounter: Payer: Self-pay | Admitting: Internal Medicine

## 2022-04-25 VITALS — BP 138/81 | HR 60 | Temp 97.8°F | Resp 12 | Ht 73.5 in | Wt 204.0 lb

## 2022-04-25 DIAGNOSIS — K635 Polyp of colon: Secondary | ICD-10-CM

## 2022-04-25 DIAGNOSIS — D122 Benign neoplasm of ascending colon: Secondary | ICD-10-CM | POA: Diagnosis not present

## 2022-04-25 DIAGNOSIS — Z1211 Encounter for screening for malignant neoplasm of colon: Secondary | ICD-10-CM | POA: Diagnosis present

## 2022-04-25 MED ORDER — SODIUM CHLORIDE 0.9 % IV SOLN: 500.0000 mL | Freq: Once | INTRAVENOUS | Status: DC

## 2022-04-25 NOTE — Progress Notes (Signed)
Called to room to assist during endoscopic procedure.  Patient ID and intended procedure confirmed with present staff. Received instructions for my participation in the procedure from the performing physician.  

## 2022-04-25 NOTE — Progress Notes (Signed)
[facility] History and Physical   Primary Care Physician:  Sherlene Shams, MD   Reason for Procedure:   CRCA screening  Plan:    colonoscopy     HPI: Samuel Barnett is a 67 y.o. male for repeat screening exam   Past Medical History:  Diagnosis Date   Arthritis    bilateral thumbs   Dysplastic nevus 11/18/2014   Left superior pectoral. Mild atypia, limited margins free.   Elevated PSA    GERD (gastroesophageal reflux disease)    hx of   Hemorrhoids    History of gastroesophageal reflux (GERD)    Prostate CA 04/05/2020   Prostate CA 03/2021   return of prostate CA-   Seasonal allergies    Spermatocele    RIGHT    Past Surgical History:  Procedure Laterality Date   COLONOSCOPY WITH PROPOFOL  01/02/2012   CG-MAC-suprep(exc)-hems   PROSTATE BIOPSY N/A 07/07/2020   Procedure: PROSTATE BIOPSY [name];  Surgeon: Orson Ape, MD;  Location: [facility];  Service: Urology;  Laterality: N/A;   SCROTAL EXPLORATION Right 11/09/2013   Procedure: SCROTUM EXPLORATION;  Surgeon: Valetta Fuller, MD;  Location: [facility];  Service: Urology;  Laterality: Right;   SPERMATOCELECTOMY Right 11/09/2013   Procedure: SPERMATOCELECTOMY;  Surgeon: Valetta Fuller, MD;  Location: [facility];  Service: Urology;  Laterality: Right;   TONSILLECTOMY  age 47    Prior to Admission medications   Medication Sig Start Date End Date Taking? Authorizing Provider  Nutritional Supplements (JUICE PLUS FIBRE PO) Take 6 capsules by mouth daily.   Yes [provider]  OVER THE COUNTER MEDICATION Take 2 capsules by mouth daily. Immune Defense   Yes [provider]  sildenafil (VIAGRA) 50 MG tablet Take 1 tablet (50 mg total) by mouth daily as needed for erectile dysfunction. 12/07/21   Sherlene Shams, MD    Current Outpatient Medications  Medication Sig Dispense Refill   Nutritional Supplements (JUICE PLUS FIBRE PO) Take 6 capsules by mouth  daily.     OVER THE COUNTER MEDICATION Take 2 capsules by mouth daily. Immune Defense     sildenafil (VIAGRA) 50 MG tablet Take 1 tablet (50 mg total) by mouth daily as needed for erectile dysfunction. 10 tablet 11   Current Facility-Administered Medications  Medication Dose Route Frequency Provider Last Rate Last Admin   0.9 %  sodium chloride infusion  500 mL Intravenous Once Iva Boop, MD        Allergies as of 04/25/2022   (No Known Allergies)    Family History  Problem Relation Age of Onset   Alcohol abuse Mother    Lung cancer Mother    Colon polyps Brother 9   Cancer Maternal Aunt        unknown etiology   Cancer Maternal Grandmother        pancreatic    Colon cancer Neg Hx    Esophageal cancer Neg Hx    Stomach cancer Neg Hx    Rectal cancer Neg Hx     Social History   Socioeconomic History   Marital status: Married    Spouse name: Not on file   Number of children: Not on file   Years of education: Not on file   Highest education level: Not on file  Occupational History   Occupation: Actuary: Production assistant, radio FOR SELF EMPLOYED  Tobacco Use   Smoking status: Never   Smokeless  tobacco: Never  Vaping Use   Vaping Use: Never used  Substance and Sexual Activity   Alcohol use: Not Currently    Comment: speical occasions   Drug use: No   Sexual activity: Not on file  Other Topics Concern   Not on file  Social History Narrative   Not on file   Social Determinants of Health   Financial Resource Strain: Not on file  Food Insecurity: Not on file  Transportation Needs: Not on file  Physical Activity: Not on file  Stress: Not on file  Social Connections: Not on file  Intimate Partner Violence: Not on file    Review of Systems:  All other review of systems negative except as mentioned in the HPI.  Physical Exam: Vital signs BP (!) 146/83   Pulse 64   Temp 97.8 F (36.6 C) (Temporal)   Resp 16   Ht 6' 1.5" (1.867 m)   Wt 204  lb (92.5 kg)   SpO2 99%   BMI 26.55 kg/m   General:   Alert,  Well-developed, well-nourished, pleasant and cooperative in NAD Lungs:  Clear throughout to auscultation.   Heart:  Regular rate and rhythm; 2/6 SEM RUSB Abdomen:  Soft, nontender and nondistended. Normal bowel sounds.   Neuro/Psych:  Alert and cooperative. Normal mood and affect. A and O x 3    Sena Slate, MD, [facility] [phone] (pager) 04/25/2022 8:01 AM@

## 2022-04-25 NOTE — Patient Instructions (Addendum)
Please read handouts provided. Continue present medications. Await pathology results.   YOU HAD AN ENDOSCOPIC PROCEDURE TODAY AT THE [facility]:   Refer to the procedure report that was given to you for any specific questions about what was found during the examination.  If the procedure report does not answer your questions, please call your gastroenterologist to clarify.  If you requested that your care partner not be given the details of your procedure findings, then the procedure report has been included in a sealed envelope for you to review at your convenience later.  YOU SHOULD EXPECT: Some feelings of bloating in the abdomen. Passage of more gas than usual.  Walking can help get rid of the air that was put into your GI tract during the procedure and reduce the bloating. If you had a lower endoscopy (such as a colonoscopy or flexible sigmoidoscopy) you may notice spotting of blood in your stool or on the toilet paper. If you underwent a bowel prep for your procedure, you may not have a normal bowel movement for a few days.  Please Note:  You might notice some irritation and congestion in your nose or some drainage.  This is from the oxygen used during your procedure.  There is no need for concern and it should clear up in a day or so.  SYMPTOMS TO REPORT IMMEDIATELY:  Following lower endoscopy (colonoscopy or flexible sigmoidoscopy):  Excessive amounts of blood in the stool  Significant tenderness or worsening of abdominal pains  Swelling of the abdomen that is new, acute  Fever of 100F or higher  For urgent or emergent issues, a gastroenterologist can be reached at any hour by calling [phone]. Do not use MyChart messaging for urgent concerns.    DIET:  We do recommend a small meal at first, but then you may proceed to your regular diet.  Drink plenty of fluids but you should avoid alcoholic beverages for 24 hours.  ACTIVITY:  You should plan to take it easy for  the rest of today and you should NOT DRIVE or use heavy machinery until tomorrow (because of the sedation medicines used during the test).    FOLLOW UP: Our staff will call the number listed on your records the next business day following your procedure.  We will call around 7:15- 8:00 am to check on you and address any questions or concerns that you may have regarding the information given to you following your procedure. If we do not reach you, we will leave a message.     If any biopsies were taken you will be contacted by phone or by letter within the next 1-3 weeks.  Please call us at [phone] if you have not heard about the biopsies in 3 weeks.    SIGNATURES/CONFIDENTIALITY: You and/or your care partner have signed paperwork which will be entered into your electronic medical record.  These signatures attest to the fact that that the information above on your After Visit Summary has been reviewed and is understood.  Full responsibility of the confidentiality of this discharge information lies with you and/or your care-partner.I found and removed one tiny polyp that looks benign.  Hemorrhoids seen again.  I will let you know pathology results and when to have another routine colonoscopy by mail and/or My Chart.  I heard a heart murmur, as we discussed. I will message Dr. Darrick Huntsman about that.  I appreciate the opportunity to care for you. Iva Boop, MD, [facility]

## 2022-04-25 NOTE — Progress Notes (Signed)
Report given to PACU, vss 

## 2022-04-25 NOTE — Progress Notes (Signed)
Pt's states no medical or surgical changes since previsit or office visit. 

## 2022-04-25 NOTE — Op Note (Signed)
[facility] Patient Name: Samuel Barnett Procedure Date: 04/25/2022 7:54 AM MRN: [id number] Endoscopist: Iva Boop , MD, [id number] Age: 67 Referring MD:  Date of Birth: 27-Oct-1955 Gender: Male Account #: [id number] Procedure:                Colonoscopy Indications:              Screening for colorectal malignant neoplasm, Last                            colonoscopy: 2014 Medicines:                Monitored Anesthesia Care Procedure:                Pre-Anesthesia Assessment:                           - Prior to the procedure, a History and Physical                            was performed, and patient medications and                            allergies were reviewed. The patient's tolerance of                            previous anesthesia was also reviewed. The risks                            and benefits of the procedure and the sedation                            options and risks were discussed with the patient.                            All questions were answered, and informed consent                            was obtained. Prior Anticoagulants: The patient has                            taken no anticoagulant or antiplatelet agents. ASA                            Grade Assessment: II - A patient with mild systemic                            disease. After reviewing the risks and benefits,                            the patient was deemed in satisfactory condition to                            undergo the procedure.  After obtaining informed consent, the colonoscope                            was passed under direct vision. Throughout the                            procedure, the patient's blood pressure, pulse, and                            oxygen saturations were monitored continuously. The                            CF [id number] #[id number] was introduced through the anus                            and advanced to the the cecum,  identified by                            appendiceal orifice and ileocecal valve. The                            colonoscopy was somewhat difficult due to a                            redundant colon and significant looping. Successful                            completion of the procedure was aided by                            straightening and shortening the scope to obtain                            bowel loop reduction and applying abdominal                            pressure. The patient tolerated the procedure well.                            The quality of the bowel preparation was adequate.                            The ileocecal valve, appendiceal orifice, and                            rectum were photographed. The bowel preparation                            used was SUPREP via split dose instruction. Scope In: 8:12:10 AM Scope Out: 8:35:15 AM Scope Withdrawal Time: 0 hours 14 minutes 48 seconds  Total Procedure Duration: 0 hours 23 minutes 5 seconds  Findings:                 The perianal and digital rectal examinations were  normal.                           A diminutive polyp was found in the ascending                            colon. The polyp was flat. The polyp was removed                            with a cold snare. Resection and retrieval were                            complete. Verification of patient identification                            for the specimen was done. Estimated blood loss was                            minimal.                           Internal hemorrhoids were found.                           The exam was otherwise without abnormality on                            direct and retroflexion views. Complications:            No immediate complications. Estimated Blood Loss:     Estimated blood loss was minimal. Impression:               - One diminutive polyp in the ascending colon,                            removed  with a cold snare. Resected and retrieved.                           - Internal hemorrhoids.                           - The examination was otherwise normal on direct                            and retroflexion views. Recommendation:           - Patient has a contact number available for                            emergencies. The signs and symptoms of potential                            delayed complications were discussed with the                            patient. Return to normal activities tomorrow.  Written discharge instructions were provided to the                            patient.                           - Continue present medications.                           - Repeat colonoscopy may be recommended. The                            colonoscopy date will be determined after pathology                            results from today's exam become available for                            review.                           He has a new systolic heart murmur - I have                            messaged PCP about this to f/u Iva Boop, MD 04/25/2022 8:52:38 AM This report has been signed electronically.

## 2022-04-26 ENCOUNTER — Telehealth: Payer: Self-pay | Admitting: *Deleted

## 2022-04-26 ENCOUNTER — Ambulatory Visit: Payer: No Typology Code available for payment source | Admitting: Dermatology

## 2022-04-26 VITALS — BP 138/82 | HR 62

## 2022-04-26 DIAGNOSIS — B079 Viral wart, unspecified: Secondary | ICD-10-CM

## 2022-04-26 DIAGNOSIS — Z1283 Encounter for screening for malignant neoplasm of skin: Secondary | ICD-10-CM

## 2022-04-26 DIAGNOSIS — L820 Inflamed seborrheic keratosis: Secondary | ICD-10-CM

## 2022-04-26 DIAGNOSIS — L821 Other seborrheic keratosis: Secondary | ICD-10-CM

## 2022-04-26 DIAGNOSIS — L578 Other skin changes due to chronic exposure to nonionizing radiation: Secondary | ICD-10-CM | POA: Diagnosis not present

## 2022-04-26 DIAGNOSIS — D229 Melanocytic nevi, unspecified: Secondary | ICD-10-CM | POA: Diagnosis not present

## 2022-04-26 DIAGNOSIS — Z79899 Other long term (current) drug therapy: Secondary | ICD-10-CM

## 2022-04-26 DIAGNOSIS — Z86018 Personal history of other benign neoplasm: Secondary | ICD-10-CM

## 2022-04-26 NOTE — Progress Notes (Signed)
Follow-Up Visit   Subjective  Weston Lybeck is a 67 y.o. male who presents for the following: Skin Cancer Screening and Full Body Skin Exam Is concerned with spots at Right second digit and  Right side area he would like checked The patient presents for Total-Body Skin Exam (TBSE) for skin cancer screening and mole check. The patient has spots, moles and lesions to be evaluated, some may be new or changing and the patient has concerns that these could be cancer.  The following portions of the chart were reviewed this encounter and updated as appropriate: medications, allergies, medical history  Review of Systems:  No other skin or systemic complaints except as noted in HPI or Assessment and Plan.  Objective  Well appearing patient in no apparent distress; mood and affect are within normal limits.  A full examination was performed including scalp, head, eyes, ears, nose, lips, neck, chest, axillae, abdomen, back, buttocks, bilateral upper extremities, bilateral lower extremities, hands, feet, fingers, toes, fingernails, and toenails. All findings within normal limits unless otherwise noted below.   Relevant physical exam findings are noted in the Assessment and Plan.  back x 5, left lateral forehead x 1, right anteiror waistline x 2 (8) Erythematous stuck-on, waxy papule or plaque  right second digit tip and subunguial 1.2 x 0.7 cm verrucous papule    Assessment & Plan   Inflamed seborrheic keratosis (8) back x 5, left lateral forehead x 1, right anteiror waistline x 2 Symptomatic, irritating, patient would like treated.  Destruction of lesion - back x 5, left lateral forehead x 1, right anteiror waistline x 2 Complexity: simple   Destruction method: cryotherapy   Informed consent: discussed and consent obtained   Timeout:  patient name, date of birth, surgical site, and procedure verified Lesion destroyed using liquid nitrogen: Yes   Region frozen until ice ball extended  beyond lesion: Yes   Outcome: patient tolerated procedure well with no complications   Post-procedure details: wound care instructions given    Viral warts, unspecified type right second digit tip and subunguial Viral Wart (HPV) Counseling  Discussed viral / HPV (Human Papilloma Virus) etiology and risk of spread /infectivity to other areas of body as well as to other people.  Multiple treatments and methods may be required to clear warts and it is possible treatment may not be successful.  Treatment risks include discoloration; scarring and there is still potential for wart recurrence.  Squaric Acid 3% applied to warts today. Prior to application reviewed risk of inflammation and irritation. Will prescribe  Start prescription 5-fluorouracil/salicylic acid wart paste from [facility] nightly under occlusion. Reviewed risk of irritation and risk scarring if applied to normal skin. If irritation develops, stop medication for a few days until area calm, then restart a very small amount just to the wart. This medication cannot be used by pregnant women. Patient advised they will receive an email from the [facility] pharmacy and can purchase the medication online through a link in the email.  Instructions for [facility] Medications  One or more of your medications was sent to the [facility] mail order compounding pharmacy. You will receive an email from them and can purchase the medicine through that link. It will then be mailed to your home at the address you confirmed. If for any reason you do not receive an email from them, please check your spam folder. If you still do not find the email, please let us know. [facility] phone  number is [phone].  Destruction of lesion - right second digit tip and subunguial Complexity: simple   Destruction method: cryotherapy   Informed consent: discussed and consent obtained   Timeout:  patient name, date of birth, surgical site, and  procedure verified Lesion destroyed using liquid nitrogen: Yes   Region frozen until ice ball extended beyond lesion: Yes   Outcome: patient tolerated procedure well with no complications   Post-procedure details: wound care instructions given    Destruction of lesion - right second digit tip and subunguial  Destruction method: chemical removal   Informed consent: discussed and consent obtained   Timeout:  patient name, date of birth, surgical site, and procedure verified Chemical destruction method: cantharidin   Application time:  4 hours Procedure instructions: patient instructed to wash and dry area   Outcome: patient tolerated procedure well with no complications   Post-procedure details: wound care instructions given   Additional details:  Cantharidin Plus is a blistering agent that comes from a beetle.  It needs to be washed off in about 4-6 hours after application.  Although it is painless when applied in office, it may cause symptoms of mild pain and burning several hours later.  Treated areas will swell and turn red, and blisters may form.  Vaseline and a bandaid may be applied until wound has healed.  Once healed, the skin may remain temporarily discolored.  It can take weeks to months for pigmentation to return to normal.  Advised to wash off with soap and water in 4-6 hours or sooner if it becomes tender before then.   LENTIGINES, SEBORRHEIC KERATOSES, HEMANGIOMAS - Benign normal skin lesions - Benign-appearing - Call for any changes  MELANOCYTIC NEVI - Tan-brown and/or pink-flesh-colored symmetric macules and papules - Benign appearing on exam today - Observation - Call clinic for new or changing moles - Recommend daily use of broad spectrum spf 30+ sunscreen to sun-exposed areas.   ACTINIC DAMAGE - Chronic condition, secondary to cumulative UV/sun exposure - diffuse scaly erythematous macules with underlying dyspigmentation - Recommend daily broad spectrum sunscreen SPF  30+ to sun-exposed areas, reapply every 2 hours as needed.  - Staying in the shade or wearing long sleeves, sun glasses (UVA+UVB protection) and wide brim hats (4-inch brim around the entire circumference of the hat) are also recommended for sun protection.  - Call for new or changing lesions.  HISTORY OF DYSPLASTIC NEVUS No evidence of recurrence today Recommend regular full body skin exams Recommend daily broad spectrum sunscreen SPF 30+ to sun-exposed areas, reapply every 2 hours as needed.  Call if any new or changing lesions are noted between office visits  SKIN CANCER SCREENING PERFORMED TODAY.  Return in about 3 months (around 07/26/2022) for wart follow up, 1 yr tbse.  IAsher Muir, CMA, am acting as scribe for Armida Sans, MD.  Documentation: I have reviewed the above documentation for accuracy and completeness, and I agree with the above.  Armida Sans, MD

## 2022-04-26 NOTE — Telephone Encounter (Signed)
  Follow up Call-     04/25/2022    7:10 AM  Call back number  Post procedure Call Back phone  # [phone]  Permission to leave phone message Yes     Patient questions:  Do you have a fever, pain , or abdominal swelling? No. Pain Score  0 *  Have you tolerated food without any problems? Yes.    Have you been able to return to your normal activities? Yes.    Do you have any questions about your discharge instructions: Diet   No. Medications  No. Follow up visit  No.  Do you have questions or concerns about your Care? No.  Actions: * If pain score is 4 or above: No action needed, pain <4.

## 2022-04-26 NOTE — Patient Instructions (Addendum)
Cantharidin Plus is a blistering agent that comes from a beetle.  It needs to be washed off in about 4 hours after application.  Although it is painless when applied in office, it may cause symptoms of mild pain and burning several hours later.  Treated areas will swell and turn red, and blisters may form.  Vaseline and a bandaid may be applied until wound has healed.  Once healed, the skin may remain temporarily discolored.  It can take weeks to months for pigmentation to return to normal.  Advised to wash off with soap and water in 4 hours or sooner if it becomes tender before then.    Cryotherapy Aftercare  Wash gently with soap and water everyday.   Apply Vaseline and Band-Aid daily until healed.      Start prescription 5-fluorouracil/salicylic acid wart paste from [facility] nightly under occlusion. Reviewed risk of irritation and risk scarring if applied to normal skin. If irritation develops, stop medication for a few days until area calm, then restart a very small amount just to the wart. This medication cannot be used by pregnant women. Patient advised they will receive an email from the [facility] pharmacy and can purchase the medication online through a link in the email.    Instructions for [facility] Medications  One or more of your medications was sent to the [facility] mail order compounding pharmacy. You will receive an email from them and can purchase the medicine through that link. It will then be mailed to your home at the address you confirmed. If for any reason you do not receive an email from them, please check your spam folder. If you still do not find the email, please let us know. [facility] phone number is [phone].       Melanoma ABCDEs  Melanoma is the most dangerous type of skin cancer, and is the leading cause of death from skin disease.  You are more likely to develop melanoma if you: Have light-colored skin, light-colored eyes,  or red or blond hair Spend a lot of time in the sun Tan regularly, either outdoors or in a tanning bed Have had blistering sunburns, especially during childhood Have a close family member who has had a melanoma Have atypical moles or large birthmarks  Early detection of melanoma is key since treatment is typically straightforward and cure rates are extremely high if we catch it early.   The first sign of melanoma is often a change in a mole or a new dark spot.  The ABCDE system is a way of remembering the signs of melanoma.  A for asymmetry:  The two halves do not match. B for border:  The edges of the growth are irregular. C for color:  A mixture of colors are present instead of an even brown color. D for diameter:  Melanomas are usually (but not always) greater than 6mm - the size of a pencil eraser. E for evolution:  The spot keeps changing in size, shape, and color.  Please check your skin once per month between visits. You can use a small mirror in front and a large mirror behind you to keep an eye on the back side or your body.   If you see any new or changing lesions before your next follow-up, please call to schedule a visit.  Please continue daily skin protection including broad spectrum sunscreen SPF 30+ to sun-exposed areas, reapplying every 2 hours as needed when you're outdoors.  Staying in the shade or wearing long sleeves, sun glasses (UVA+UVB protection) and wide brim hats (4-inch brim around the entire circumference of the hat) are also recommended for sun protection.     Due to recent changes in healthcare laws, you may see results of your pathology and/or laboratory studies on MyChart before the doctors have had a chance to review them. We understand that in some cases there may be results that are confusing or concerning to you. Please understand that not all results are received at the same time and often the doctors may need to interpret multiple results in order to  provide you with the best plan of care or course of treatment. Therefore, we ask that you please give [place] 2 business days to thoroughly review all your results before contacting the office for clarification. Should we see a critical lab result, you will be contacted sooner.   If You Need Anything After Your Visit  If you have any questions or concerns for your doctor, please call our main line at [phone] and press option 4 to reach your doctor's medical assistant. If no one answers, please leave a voicemail as directed and we will return your call as soon as possible. Messages left after 4 pm will be answered the following business day.   You may also send [place] a message via MyChart. We typically respond to MyChart messages within 1-2 business days.  For prescription refills, please ask your pharmacy to contact our office. Our fax number is [phone].  If you have an urgent issue when the clinic is closed that cannot wait until the next business day, you can page your doctor at the number below.    Please note that while we do our best to be available for urgent issues outside of office hours, we are not available 24/7.   If you have an urgent issue and are unable to reach [place], you may choose to seek medical care at your doctor's office, retail clinic, urgent care center, or emergency room.  If you have a medical emergency, please immediately call 911 or go to the emergency department.  Pager Numbers  - Dr. Gwen Pounds: [phone]  - Dr. Neale Burly: [phone]  - Dr. Roseanne Reno: [phone]  In the event of inclement weather, please call our main line at [phone] for an update on the status of any delays or closures.  Dermatology Medication Tips: Please keep the boxes that topical medications come in in order to help keep track of the instructions about where and how to use these. Pharmacies typically print the medication instructions only on the boxes and not directly on the  medication tubes.   If your medication is too expensive, please contact our office at [phone] option 4 or send [place] a message through MyChart.   We are unable to tell what your co-pay for medications will be in advance as this is different depending on your insurance coverage. However, we may be able to find a substitute medication at lower cost or fill out paperwork to get insurance to cover a needed medication.   If a prior authorization is required to get your medication covered by your insurance company, please allow [place] 1-2 business days to complete this process.  Drug prices often vary depending on where the prescription is filled and some pharmacies may offer cheaper prices.  The website www.goodrx.com contains coupons for medications through different pharmacies. The prices here do not account for what the cost may be with help  from insurance (it may be cheaper with your insurance), but the website can give you the price if you did not use any insurance.  - You can print the associated coupon and take it with your prescription to the pharmacy.  - You may also stop by our office during regular business hours and pick up a GoodRx coupon card.  - If you need your prescription sent electronically to a different pharmacy, notify our office through [facility] or by phone at [phone] option 4.     Si Usted Necesita Algo Despus de Su Visita  Tambin puede enviarnos un mensaje a travs de Clinical cytogeneticist. Por lo general respondemos a los mensajes de MyChart en el transcurso de 1 a 2 das hbiles.  Para renovar recetas, por favor pida a su farmacia que se ponga en contacto con nuestra oficina. Annie Sable de fax es [place] [phone].  Si tiene un asunto urgente cuando la clnica est cerrada y que no puede esperar hasta el siguiente da hbil, puede llamar/localizar a su doctor(a) al nmero que aparece a continuacin.   Por favor, tenga en cuenta que aunque hacemos todo lo posible  para estar disponibles para asuntos urgentes fuera del horario de [place], no estamos disponibles las 24 horas del da, los 7 [address]  [address] de la [place].   Si tiene un problema urgente y no puede comunicarse con nosotros, puede optar por buscar atencin mdica  en el consultorio de su doctor(a), en una clnica privada, en un centro de atencin urgente o en una sala de emergencias.  Si tiene Engineer, drilling, por favor llame inmediatamente al 911 o vaya a la sala de emergencias.  Nmeros de bper  - Dr. Gwen Pounds: [phone]  - Dra. Moye: [phone]  - Dra. Roseanne Reno: [phone]  En caso de inclemencias del [place], por favor llame a Lacy Duverney principal al [phone] para una actualizacin sobre el [place] de cualquier retraso o cierre.  Consejos para la medicacin en dermatologa: Por favor, guarde las cajas en las que vienen los medicamentos de uso tpico para ayudarle a seguir las instrucciones sobre dnde y cmo usarlos. Las farmacias generalmente imprimen las instrucciones del medicamento slo en las cajas y no directamente en los tubos del [place].   Si su medicamento es muy caro, por favor, pngase en contacto con Rolm Gala llamando al [phone] y presione la opcin 4 o envenos un mensaje a travs de Clinical cytogeneticist.   No podemos decirle cul ser su copago por los medicamentos por adelantado ya que esto es diferente dependiendo de la cobertura de su seguro. Sin embargo, es posible que podamos encontrar un medicamento sustituto a Audiological scientist un formulario para que el seguro cubra el medicamento que se considera necesario.   Si se requiere una autorizacin previa para que su compaa de seguros [place] su medicamento, por favor permtanos de 1 a 2 das hbiles para completar [address]  Los precios de los medicamentos varan con frecuencia dependiendo del Environmental consultant de dnde se surte la receta y alguna farmacias pueden ofrecer precios ms baratos.  El sitio web  www.goodrx.com tiene cupones para medicamentos de Health and safety inspector. Los precios aqu no tienen en cuenta lo que podra costar con la ayuda del seguro (puede ser ms barato con su seguro), pero el sitio web puede darle el precio si no utiliz Tourist information centre manager.  - Puede imprimir el cupn correspondiente y llevarlo con su receta a la farmacia.  - Tambin puede pasar por nuestra oficina durante el horario  de atencin regular y Charity fundraiser una tarjeta de cupones de GoodRx.  - Si necesita que su receta se enve electrnicamente a una farmacia diferente, informe a nuestra oficina a travs de MyChart de [place] o por telfono llamando al [phone] y presione la opcin 4.

## 2022-04-27 ENCOUNTER — Ambulatory Visit
Admission: RE | Admit: 2022-04-27 | Discharge: 2022-04-27 | Disposition: A | Discharge status: Home / Self Care | Payer: No Typology Code available for payment source | Source: Ambulatory Visit | Attending: Radiation Oncology | Admitting: Radiation Oncology

## 2022-04-27 DIAGNOSIS — C61 Malignant neoplasm of prostate: Secondary | ICD-10-CM | POA: Insufficient documentation

## 2022-04-27 MED ORDER — GADOBUTROL 1 MMOL/ML IV SOLN
10.0000 mL | Freq: Once | INTRAVENOUS | Status: AC | PRN
  Administered 2022-04-27: 10 mL via INTRAVENOUS

## 2022-05-04 ENCOUNTER — Encounter: Payer: Self-pay | Admitting: Dermatology

## 2022-05-08 ENCOUNTER — Encounter: Payer: Self-pay | Admitting: Internal Medicine

## 2022-06-01 ENCOUNTER — Telehealth: Payer: Self-pay | Admitting: *Deleted

## 2022-06-01 DIAGNOSIS — I700 Atherosclerosis of aorta: Secondary | ICD-10-CM

## 2022-06-01 NOTE — Telephone Encounter (Signed)
Please place future orders for lab appt.  

## 2022-06-07 ENCOUNTER — Other Ambulatory Visit (INDEPENDENT_AMBULATORY_CARE_PROVIDER_SITE_OTHER): Payer: No Typology Code available for payment source

## 2022-06-07 DIAGNOSIS — I700 Atherosclerosis of aorta: Secondary | ICD-10-CM | POA: Diagnosis not present

## 2022-06-07 LAB — COMPREHENSIVE METABOLIC PANEL
ALT: 15 U/L (ref 0–53)
AST: 17 U/L (ref 0–37)
Albumin: 4.5 g/dL (ref 3.5–5.2)
Alkaline Phosphatase: 58 U/L (ref 39–117)
BUN: 17 mg/dL (ref 6–23)
CO2: 28 mEq/L (ref 19–32)
Calcium: 9.4 mg/dL (ref 8.4–10.5)
Chloride: 104 mEq/L (ref 96–112)
Creatinine, Ser: 1.03 mg/dL (ref 0.40–1.50)
GFR: 75.36 mL/min (ref >60.00)
Glucose, Bld: 97 mg/dL (ref 70–99)
Potassium: 4.2 mEq/L (ref 3.5–5.1)
Sodium: 141 mEq/L (ref 135–145)
Total Bilirubin: 0.5 mg/dL (ref 0.2–1.2)
Total Protein: 7.2 g/dL (ref 6.0–8.3)

## 2022-06-08 ENCOUNTER — Other Ambulatory Visit: Payer: Self-pay | Admitting: Internal Medicine

## 2022-06-08 DIAGNOSIS — C61 Malignant neoplasm of prostate: Secondary | ICD-10-CM

## 2022-06-08 LAB — LIPID PANEL W/REFLEX DIRECT LDL
Non-HDL Cholesterol (Calc): 149 mg/dL (calc) — ABNORMAL HIGH (ref <130)
Triglycerides: 60 mg/dL (ref <150)

## 2022-06-09 LAB — TEST AUTHORIZATION

## 2022-06-10 LAB — LIPID PANEL W/REFLEX DIRECT LDL
Cholesterol: 224 mg/dL — ABNORMAL HIGH (ref <200)
HDL: 75 mg/dL (ref >=40)
LDL Cholesterol (Calc): 134 mg/dL (calc) — ABNORMAL HIGH
Total CHOL/HDL Ratio: 3 (calc) (ref <5.0)

## 2022-06-10 LAB — TEST AUTHORIZATION

## 2022-06-10 LAB — PSA: PSA: 2.17 ng/mL (ref <=4.00)

## 2022-06-12 ENCOUNTER — Encounter: Payer: Self-pay | Admitting: Internal Medicine

## 2022-06-12 DIAGNOSIS — R931 Abnormal findings on diagnostic imaging of heart and coronary circulation: Secondary | ICD-10-CM | POA: Insufficient documentation

## 2022-08-02 ENCOUNTER — Ambulatory Visit: Payer: No Typology Code available for payment source | Admitting: Dermatology

## 2022-08-02 VITALS — BP 138/77

## 2022-08-02 DIAGNOSIS — L820 Inflamed seborrheic keratosis: Secondary | ICD-10-CM

## 2022-08-02 DIAGNOSIS — Z7189 Other specified counseling: Secondary | ICD-10-CM

## 2022-08-02 DIAGNOSIS — B079 Viral wart, unspecified: Secondary | ICD-10-CM

## 2022-08-02 DIAGNOSIS — Z79899 Other long term (current) drug therapy: Secondary | ICD-10-CM | POA: Diagnosis not present

## 2022-08-02 NOTE — Patient Instructions (Addendum)
Cryotherapy Aftercare  Wash gently with soap and water everyday.   Apply Vaseline and Band-Aid daily until healed.   Instructions for After In-Office Application of Cantharidin  1. This is a strong medicine; please follow ALL instructions.  2. Gently wash off with soap and water in 4-6 hours or sooner s directed by your physician.  3. **WARNING** this medicine can cause severe blistering, blood blisters, infection, and/or scarring if it is not washed off as directed.  4. Your progress will be rechecked in 1-2 months; call sooner if there are any questions or problems.     Due to recent changes in healthcare laws, you may see results of your pathology and/or laboratory studies on MyChart before the doctors have had a chance to review them. We understand that in some cases there may be results that are confusing or concerning to you. Please understand that not all results are received at the same time and often the doctors may need to interpret multiple results in order to provide you with the best plan of care or course of treatment. Therefore, we ask that you please give Samuel Barnett 2 business days to thoroughly review all your results before contacting the office for clarification. Should we see a critical lab result, you will be contacted sooner.   If You Need Anything After Your Visit  If you have any questions or concerns for your doctor, please call our main line at [phone] and press option 4 to reach your doctor's medical assistant. If no one answers, please leave a voicemail as directed and we will return your call as soon as possible. Messages left after 4 pm will be answered the following business day.   You may also send Samuel Barnett a message via MyChart. We typically respond to MyChart messages within 1-2 business days.  For prescription refills, please ask your pharmacy to contact our office. Our fax number is [phone].  If you have an urgent issue when the clinic is closed that  cannot wait until the next business day, you can page your doctor at the number below.    Please note that while we do our best to be available for urgent issues outside of office hours, we are not available 24/7.   If you have an urgent issue and are unable to reach Samuel Barnett, you may choose to seek medical care at your doctor's office, retail clinic, urgent care center, or emergency room.  If you have a medical emergency, please immediately call 911 or go to the emergency department.  Pager Numbers  - Dr. Gwen Pounds: [phone]  - Dr. Roseanne Reno: [phone]  In the event of inclement weather, please call our main line at [phone] for an update on the status of any delays or closures.  Dermatology Medication Tips: Please keep the boxes that topical medications come in in order to help keep track of the instructions about where and how to use these. Pharmacies typically print the medication instructions only on the boxes and not directly on the medication tubes.   If your medication is too expensive, please contact our office at [phone] option 4 or send Samuel Barnett a message through MyChart.   We are unable to tell what your co-pay for medications will be in advance as this is different depending on your insurance coverage. However, we may be able to find a substitute medication at lower cost or fill out paperwork to get insurance to cover a needed medication.   If a prior authorization is required to  get your medication covered by your insurance company, please allow Samuel Barnett 1-2 business days to complete this process.  Drug prices often vary depending on where the prescription is filled and some pharmacies may offer cheaper prices.  The website www.goodrx.com contains coupons for medications through different pharmacies. The prices here do not account for what the cost may be with help from insurance (it may be cheaper with your insurance), but the website can give you the price if you did not use any  insurance.  - You can print the associated coupon and take it with your prescription to the pharmacy.  - You may also stop by our office during regular business hours and pick up a GoodRx coupon card.  - If you need your prescription sent electronically to a different pharmacy, notify our office through [facility] or by phone at [phone] option 4.     Si Usted Necesita Algo Despus de Su Visita  Tambin puede enviarnos un mensaje a travs de Clinical cytogeneticist. Por lo general respondemos a los mensajes de MyChart en el transcurso de 1 a 2 das hbiles.  Para renovar recetas, por favor pida a su farmacia que se ponga en contacto con nuestra oficina. Annie Sable de fax es [place] [phone].  Si tiene un asunto urgente cuando la clnica est cerrada y que no puede esperar hasta el siguiente da hbil, puede llamar/localizar a su doctor(a) al nmero que aparece a continuacin.   Por favor, tenga en cuenta que aunque hacemos todo lo posible para estar disponibles para asuntos urgentes fuera del horario de [place], no estamos disponibles las 24 horas del da, los 7 [address]  [address] de la [place].   Si tiene un problema urgente y no puede comunicarse con nosotros, puede optar por buscar atencin mdica  en el consultorio de su doctor(a), en una clnica privada, en un centro de atencin urgente o en una sala de emergencias.  Si tiene Engineer, drilling, por favor llame inmediatamente al 911 o vaya a la sala de emergencias.  Nmeros de bper  - Dr. Gwen Pounds: [phone]  - Dra. Roseanne Reno: [phone]  En caso de inclemencias del [place], por favor llame a Lacy Duverney principal al [phone] para una actualizacin sobre el [place] de cualquier retraso o cierre.  Consejos para la medicacin en dermatologa: Por favor, guarde las cajas en las que vienen los medicamentos de uso tpico para ayudarle a seguir las instrucciones sobre dnde y cmo usarlos. Las farmacias generalmente imprimen las  instrucciones del medicamento slo en las cajas y no directamente en los tubos del [place].   Si su medicamento es muy caro, por favor, pngase en contacto con Rolm Gala llamando al [phone] y presione la opcin 4 o envenos un mensaje a travs de Clinical cytogeneticist.   No podemos decirle cul ser su copago por los medicamentos por adelantado ya que esto es diferente dependiendo de la cobertura de su seguro. Sin embargo, es posible que podamos encontrar un medicamento sustituto a Audiological scientist un formulario para que el seguro cubra el medicamento que se considera necesario.   Si se requiere una autorizacin previa para que su compaa de seguros [place] su medicamento, por favor permtanos de 1 a 2 das hbiles para completar [address]  Los precios de los medicamentos varan con frecuencia dependiendo del Environmental consultant de dnde se surte la receta y alguna farmacias pueden ofrecer precios ms baratos.  El sitio web www.goodrx.com tiene cupones para medicamentos de Health and safety inspector. Los precios aqu no tienen  en cuenta lo que podra costar con la ayuda del seguro (puede ser ms barato con su seguro), pero el sitio web puede darle el precio si no Visual merchandiser.  - Puede imprimir el cupn correspondiente y llevarlo con su receta a la farmacia.  - Tambin puede pasar por nuestra oficina durante el horario de atencin regular y Education officer, museum una tarjeta de cupones de GoodRx.  - Si necesita que su receta se enve electrnicamente a una farmacia diferente, informe a nuestra oficina a travs de MyChart de [place] o por telfono llamando al [phone] y presione la opcin 4.

## 2022-08-02 NOTE — Progress Notes (Signed)
Follow-Up Visit   Subjective  Samuel Barnett is a 67 y.o. male who presents for the following: wart R 2nd digit tip and subunguial, used wart paste x 2 wks, then d/c and used again, squaric acid sensitization at last visit with moderate reaction The patient has spots, moles and lesions to be evaluated, some may be new or changing and the patient may have concern these could be cancer.   The following portions of the chart were reviewed this encounter and updated as appropriate: medications, allergies, medical history  Review of Systems:  No other skin or systemic complaints except as noted in HPI or Assessment and Plan.  Objective  Well appearing patient in no apparent distress; mood and affect are within normal limits.   A focused examination was performed of the following areas: R hand  Relevant exam findings are noted in the Assessment and Plan.  L second digit tip and subunguial x 1 Verrucous papules -- Discussed viral etiology and contagion. 0.8 x 0.6cm     L temple x 2 (2) Stuck on waxy paps with erythema    Assessment & Plan     Viral warts, unspecified type L second digit tip and subunguial x 1  Viral Wart (HPV) Counseling  Discussed viral / HPV (Human Papilloma Virus) etiology and risk of spread /infectivity to other areas of body as well as to other people.  Multiple treatments and methods may be required to clear warts and it is possible treatment may not be successful.  Treatment risks include discoloration; scarring and there is still potential for wart recurrence.  Cont [facility] wart paste qhs as tolerated  Destruction of lesion - L second digit tip and subunguial x 1 Complexity: simple   Destruction method: cryotherapy   Informed consent: discussed and consent obtained   Timeout:  patient name, date of birth, surgical site, and procedure verified Lesion destroyed using liquid nitrogen: Yes   Region frozen until ice ball extended beyond  lesion: Yes   Outcome: patient tolerated procedure well with no complications   Post-procedure details: wound care instructions given    Destruction of lesion - L second digit tip and subunguial x 1  Destruction method: chemical removal   Informed consent: discussed and consent obtained   Timeout:  patient name, date of birth, surgical site, and procedure verified Chemical destruction method comment:  Squaric acid 3%, cantharidin plus Procedure instructions: patient instructed to wash and dry area   Outcome: patient tolerated procedure well with no complications   Post-procedure details: wound care instructions given   Additional details:  Cantharidin Plus is a blistering agent that comes from a beetle.  It needs to be washed off in about 4-12 hours after application.  Although it is painless when applied in office, it may cause symptoms of mild pain and burning several hours later.  Treated areas will swell and turn red, and blisters may form.  Vaseline and a bandaid may be applied until wound has healed.  Once healed, the skin may remain temporarily discolored.  It can take weeks to months for pigmentation to return to normal.  Advised to wash off with soap and water in 4-6 hours or sooner if it becomes tender before then.   Inflamed seborrheic keratosis (2) L temple x 2  Symptomatic, irritating, patient would like treated.   Destruction of lesion - L temple x 2 (2) Complexity: simple   Destruction method: cryotherapy   Informed consent: discussed and consent obtained  Timeout:  patient name, date of birth, surgical site, and procedure verified Lesion destroyed using liquid nitrogen: Yes   Region frozen until ice ball extended beyond lesion: Yes   Outcome: patient tolerated procedure well with no complications   Post-procedure details: wound care instructions given      Return in about 3 months (around 11/02/2022) for wart f/u.  I, Ardis Rowan, RMA, am acting as scribe for Armida Sans, MD .   Documentation: I have reviewed the above documentation for accuracy and completeness, and I agree with the above.  Armida Sans, MD

## 2022-08-07 ENCOUNTER — Encounter: Payer: Self-pay | Admitting: Dermatology

## 2022-10-09 ENCOUNTER — Ambulatory Visit (INDEPENDENT_AMBULATORY_CARE_PROVIDER_SITE_OTHER): Payer: No Typology Code available for payment source | Admitting: Internal Medicine

## 2022-10-09 ENCOUNTER — Encounter: Payer: Self-pay | Admitting: Internal Medicine

## 2022-10-09 VITALS — BP 142/80 | HR 64 | Ht 73.5 in | Wt 202.0 lb

## 2022-10-09 DIAGNOSIS — Z23 Encounter for immunization: Secondary | ICD-10-CM

## 2022-10-09 DIAGNOSIS — H9313 Tinnitus, bilateral: Secondary | ICD-10-CM | POA: Diagnosis not present

## 2022-10-09 DIAGNOSIS — R5383 Other fatigue: Secondary | ICD-10-CM

## 2022-10-09 DIAGNOSIS — E785 Hyperlipidemia, unspecified: Secondary | ICD-10-CM

## 2022-10-09 DIAGNOSIS — H9193 Unspecified hearing loss, bilateral: Secondary | ICD-10-CM | POA: Diagnosis not present

## 2022-10-09 DIAGNOSIS — R7301 Impaired fasting glucose: Secondary | ICD-10-CM

## 2022-10-09 DIAGNOSIS — E559 Vitamin D deficiency, unspecified: Secondary | ICD-10-CM | POA: Diagnosis not present

## 2022-10-09 DIAGNOSIS — Z0000 Encounter for general adult medical examination without abnormal findings: Secondary | ICD-10-CM | POA: Diagnosis not present

## 2022-10-09 DIAGNOSIS — C61 Malignant neoplasm of prostate: Secondary | ICD-10-CM | POA: Diagnosis not present

## 2022-10-09 LAB — COMPREHENSIVE METABOLIC PANEL
ALT: 15 U/L (ref 0–53)
AST: 15 U/L (ref 0–37)
Albumin: 4.6 g/dL (ref 3.5–5.2)
Alkaline Phosphatase: 57 U/L (ref 39–117)
BUN: 13 mg/dL (ref 6–23)
CO2: 27 meq/L (ref 19–32)
Calcium: 9.4 mg/dL (ref 8.4–10.5)
Chloride: 104 meq/L (ref 96–112)
Creatinine, Ser: 0.91 mg/dL (ref 0.40–1.50)
GFR: 87.22 mL/min (ref >60.00)
Glucose, Bld: 95 mg/dL (ref 70–99)
Potassium: 3.9 meq/L (ref 3.5–5.1)
Sodium: 140 meq/L (ref 135–145)
Total Bilirubin: 0.6 mg/dL (ref 0.2–1.2)
Total Protein: 6.6 g/dL (ref 6.0–8.3)

## 2022-10-09 LAB — LIPID PANEL
Cholesterol: 224 mg/dL — ABNORMAL HIGH (ref 0–200)
HDL: 82.5 mg/dL (ref >39.00)
LDL Cholesterol: 125 mg/dL — ABNORMAL HIGH (ref 0–99)
NonHDL: 141.18
Total CHOL/HDL Ratio: 3
Triglycerides: 80 mg/dL (ref 0.0–149.0)
VLDL: 16 mg/dL (ref 0.0–40.0)

## 2022-10-09 LAB — CBC WITH DIFFERENTIAL/PLATELET
Basophils Absolute: 0.1 10*3/uL (ref 0.0–0.1)
Basophils Relative: 1 % (ref 0.0–3.0)
Eosinophils Absolute: 0 10*3/uL (ref 0.0–0.7)
Eosinophils Relative: 0.8 % (ref 0.0–5.0)
HCT: 42.8 % (ref 39.0–52.0)
Hemoglobin: 14.1 g/dL (ref 13.0–17.0)
Lymphocytes Relative: 32.6 % (ref 12.0–46.0)
Lymphs Abs: 1.9 10*3/uL (ref 0.7–4.0)
MCHC: 33 g/dL (ref 30.0–36.0)
MCV: 97.2 fL (ref 78.0–100.0)
Monocytes Absolute: 0.4 10*3/uL (ref 0.1–1.0)
Monocytes Relative: 7.4 % (ref 3.0–12.0)
Neutro Abs: 3.5 10*3/uL (ref 1.4–7.7)
Neutrophils Relative %: 58.2 % (ref 43.0–77.0)
Platelets: 284 10*3/uL (ref 150.0–400.0)
RBC: 4.4 Mil/uL (ref 4.22–5.81)
RDW: 14.4 % (ref 11.5–15.5)
WBC: 5.9 10*3/uL (ref 4.0–10.5)

## 2022-10-09 LAB — LDL CHOLESTEROL, DIRECT: Direct LDL: 122 mg/dL

## 2022-10-09 LAB — PSA: PSA: 6.49 ng/mL — ABNORMAL HIGH (ref 0.10–4.00)

## 2022-10-09 LAB — VITAMIN D 25 HYDROXY (VIT D DEFICIENCY, FRACTURES): VITD: 54.74 ng/mL (ref 30.00–100.00)

## 2022-10-09 LAB — HEMOGLOBIN A1C: Hgb A1c MFr Bld: 5.6 % (ref 4.6–6.5)

## 2022-10-09 LAB — TSH: TSH: 1.4 u[IU]/mL (ref 0.35–5.50)

## 2022-10-09 NOTE — Patient Instructions (Signed)
I recommend following up with [facility] for a recheck  Audiology.[facility] referral made   You might want to try using Relaxium for insomnia  (as seen on TV commercials) . It is available through [employer] and contains all natural supplements:  Melatonin 5 mg  Chamomile 25 mg Passionflower extract 75 mg GABA 100 mg Ashwaganda extract 125 mg Magnesium citrate, glycinate, oxide (100 mg)  L tryptophan 500 mg Valerest (proprietary  ingredient ; probably valeria root extract)    Medicare Part D,  healthteam advantage   Get prevnar 20  on a Friday at [facility] or here (yes we do rn visit s on Fridays)  Flu  vaccine today

## 2022-10-09 NOTE — Assessment & Plan Note (Addendum)
He was referred back  to [facility] for rising PSA, in March.  Reviewed  [facility]  last note in May 2024: "He returns today with interval imaging which demonstrates a PI-RADS 2 lesion in the area of his previously biopsy-proven prostate cancer without restricted diffusion to indicate active tumor. 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 - At the current time, it is not clear to me that his rising PSA is indicative of recurrent or residual disease, but may just be recovery of testosterone following ADT " .  Patient confirms that he feels great and attributes the improvement to a normalized testosterone level  However,  the stated plan was to  for the [facility] rad onc to present his case to the Tumor Board,  However patient was never contacted with an update despite his making 2 calls to [facility] to determine the status  of his case.  I have rechecked  his PSA today and notes that it has tripled since June,  and have attempted to contact Dr Camelia Phenes by phone. I am waiting for a call back. His labs have been faxed to [facility]

## 2022-10-09 NOTE — Assessment & Plan Note (Signed)
With recent appreciation of loss of hearing in the upper registers .  Referring to [facility] Willeen Cass preferred by patient ) for hearing exam and management

## 2022-10-09 NOTE — Progress Notes (Signed)
Patient ID: Samuel Barnett, male    DOB: 1955/04/27  Age: 67 y.o. MRN: [id number]  The patient is here for annual preventive examination and management of other chronic and acute problems.   The risk factors are reflected in the social history.   The roster of all physicians providing medical care to patient - is listed in the Snapshot section of the chart.   Activities of daily living:  The patient is 100% independent in all ADLs: dressing, toileting, feeding as well as independent mobility   Home safety : The patient has smoke detectors in the home. They wear seatbelts.  There are no unsecured firearms at home. There is no violence in the home.    There is no risks for hepatitis, STDs or HIV. There is no   history of blood transfusion. They have no travel history to infectious disease endemic areas of the world.   The patient has seen their dentist in the last six month. They have seen their eye doctor in the last year. The patinet  denies slight hearing difficulty with regard to whispered voices and some television programs.  They have deferred audiologic testing in the last year.  They do not  have excessive sun exposure. Discussed the need for sun protection: hats, long sleeves and use of sunscreen if there is significant sun exposure.    Diet: the importance of a healthy diet is discussed. They do have a healthy diet.   The benefits of regular aerobic exercise were discussed. The patient  exercises  3 to 5 days per week  for  60 minutes.    Depression screen: there are no signs or vegative symptoms of depression- irritability, change in appetite, anhedonia, sadness/tearfullness.   The following portions of the patient's history were reviewed and updated as appropriate: allergies, current medications, past family history, past medical history,  past surgical history, past social history  and problem list.   Visual acuity was not assessed per patient preference since the patient has  regular follow up with an  ophthalmologist. Hearing and body mass index were assessed and reviewed.    During the course of the visit the patient was educated and counseled about appropriate screening and preventive services including : fall prevention , diabetes screening, nutrition counseling, colorectal cancer screening, and recommended immunizations.    Chief Complaint:  History  of blunt trauma  in March left eye from tennis ball.     Review of Symptoms  Patient denies headache, fevers, malaise, unintentional weight loss, skin rash, eye pain, sinus congestion and sinus pain, sore throat, dysphagia,  hemoptysis , cough, dyspnea, wheezing, chest pain, palpitations, orthopnea, edema, abdominal pain, nausea, melena, diarrhea, constipation, flank pain, dysuria, hematuria, urinary  Frequency, nocturia, numbness, tingling, seizures,  Focal weakness, Loss of consciousness,  Tremor, insomnia, depression, anxiety, and suicidal ideation.    Physical Exam:  BP (!) 142/80   Pulse 64   Ht 6' 1.5" (1.867 m)   Wt 202 lb (91.6 kg)   SpO2 99%   BMI 26.29 kg/m    Physical Exam Vitals reviewed.  Constitutional:      General: He is not in acute distress.    Appearance: Normal appearance. He is normal weight. He is not ill-appearing, toxic-appearing or diaphoretic.  HENT:     Head: Normocephalic and atraumatic.     Right Ear: Tympanic membrane, ear canal and external ear normal. There is no impacted cerumen.     Left Ear: Tympanic membrane, ear canal and  external ear normal. There is no impacted cerumen.     Nose: Nose normal.     Mouth/Throat:     Mouth: Mucous membranes are moist.     Pharynx: Oropharynx is clear.  Eyes:     General: No scleral icterus.       Right eye: No discharge.        Left eye: No discharge.     Conjunctiva/sclera: Conjunctivae normal.  Neck:     Thyroid: No thyromegaly.     Vascular: No carotid bruit or JVD.  Cardiovascular:     Rate and Rhythm: Normal rate and  regular rhythm.     Heart sounds: Normal heart sounds.  Pulmonary:     Effort: Pulmonary effort is normal. No respiratory distress.     Breath sounds: Normal breath sounds.  Abdominal:     General: Bowel sounds are normal.     Palpations: Abdomen is soft. There is no mass.     Tenderness: There is no abdominal tenderness. There is no guarding or rebound.  Musculoskeletal:        General: Normal range of motion.     Cervical back: Normal range of motion and neck supple.  Lymphadenopathy:     Cervical: No cervical adenopathy.  Skin:    General: Skin is warm and dry.  Neurological:     General: No focal deficit present.     Mental Status: He is alert and oriented to person, place, and time. Mental status is at baseline.  Psychiatric:        Mood and Affect: Mood normal.        Behavior: Behavior normal.        Thought Content: Thought content normal.        Judgment: Judgment normal.    Assessment and Plan: Encounter for preventative adult health care examination  Prostate cancer [facility]) Assessment & Plan: He was referred back  to [facility] for rising PSA, in March.  Reviewed  [facility]  last note in May 2024: "He returns today with interval imaging which demonstrates a PI-RADS 2 lesion in the area of his previously biopsy-proven prostate cancer without restricted diffusion to indicate active tumor. 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 - At the current time, it is not clear to me that his rising PSA is indicative of recurrent or residual disease, but may just be recovery of testosterone following ADT " .  Patient confirms that he feels great and attributes the improvement to a normalized testosterone level  However,  the stated plan was to  for the [facility] rad onc to present his case to the Tumor Board,  However patient was never contacted with an  update despite his making 2 calls to [facility] to determine the status  of his case.  I have rechecked  his PSA today and notes that it has tripled since June,  and have attempted to contact Dr Camelia Phenes by phone. I am waiting for a call back. His labs have been faxed to [facility] Rad Onc  Orders: -     PSA  Other fatigue -     CBC with Differential/Platelet -     TSH  Impaired fasting glucose -     Comprehensive metabolic panel -     Hemoglobin A1c -     Microalbumin / creatinine urine ratio  Hyperlipidemia, unspecified  hyperlipidemia type -     Lipid panel -     LDL cholesterol, direct  Hearing loss associated with syndrome of both ears -     Ambulatory referral to ENT  Vitamin D deficiency -     VITAMIN D 25 Hydroxy (Vit-D Deficiency, Fractures)  Need for influenza vaccination -     Flu Vaccine Trivalent High Dose (Fluad)  Tinnitus of both ears Assessment & Plan: With recent appreciation of loss of hearing in the upper registers .  Referring to [facility] Willeen Cass preferred by patient ) for hearing exam and management     No follow-ups on file.  Sherlene Shams, MD

## 2022-10-10 LAB — MICROALBUMIN / CREATININE URINE RATIO
Creatinine,U: 23.1 mg/dL
Microalb Creat Ratio: 3 mg/g (ref 0.0–30.0)
Microalb, Ur: <0.7 mg/dL (ref 0.0–1.9)

## 2022-11-08 ENCOUNTER — Ambulatory Visit: Payer: No Typology Code available for payment source | Admitting: Dermatology

## 2022-11-08 DIAGNOSIS — L820 Inflamed seborrheic keratosis: Secondary | ICD-10-CM | POA: Diagnosis not present

## 2022-11-08 DIAGNOSIS — W908XXA Exposure to other nonionizing radiation, initial encounter: Secondary | ICD-10-CM | POA: Diagnosis not present

## 2022-11-08 DIAGNOSIS — D1801 Hemangioma of skin and subcutaneous tissue: Secondary | ICD-10-CM

## 2022-11-08 DIAGNOSIS — L578 Other skin changes due to chronic exposure to nonionizing radiation: Secondary | ICD-10-CM

## 2022-11-08 DIAGNOSIS — B079 Viral wart, unspecified: Secondary | ICD-10-CM

## 2022-11-08 DIAGNOSIS — L821 Other seborrheic keratosis: Secondary | ICD-10-CM | POA: Diagnosis not present

## 2022-11-08 DIAGNOSIS — L814 Other melanin hyperpigmentation: Secondary | ICD-10-CM

## 2022-11-08 DIAGNOSIS — D229 Melanocytic nevi, unspecified: Secondary | ICD-10-CM

## 2022-11-08 MED ORDER — CLOBETASOL PROPIONATE 0.05 % EX OINT: TOPICAL_OINTMENT | CUTANEOUS | 0 refills | Status: DC

## 2022-11-08 NOTE — Progress Notes (Signed)
Follow-Up Visit   Subjective  Samuel Barnett is a 67 y.o. male who presents for the following: Recheck L temple ISK, wart on the L 2nd finger, and an irregular skin lesion on the abdomen that patient is concerned about and would like checked today.  The patient has spots, moles and lesions to be evaluated, some may be new or changing and the patient may have concern these could be cancer.   The following portions of the chart were reviewed this encounter and updated as appropriate: medications, allergies, medical history  Review of Systems:  No other skin or systemic complaints except as noted in HPI or Assessment and Plan.  Objective  Well appearing patient in no apparent distress; mood and affect are within normal limits.    A focused examination was performed of the following areas: the face, abdomen, and hands   Relevant exam findings are noted in the Assessment and Plan.  L temple x 1, L infraorbital x 4, R forehead and R temple x 7 (12) Erythematous stuck-on, waxy papule or plaque  L second digit tip and subunguial x 1 Verrucous papules -- Discussed viral etiology and contagion.        Assessment & Plan   Inflamed seborrheic keratosis (12) L temple x 1, L infraorbital x 4, R forehead and R temple x 7  Symptomatic, irritating, patient would like treated.   Destruction of lesion - L temple x 1, L infraorbital x 4, R forehead and R temple x 7 (12) Complexity: simple   Destruction method: cryotherapy   Informed consent: discussed and consent obtained   Timeout:  patient name, date of birth, surgical site, and procedure verified Lesion destroyed using liquid nitrogen: Yes   Region frozen until ice ball extended beyond lesion: Yes   Outcome: patient tolerated procedure well with no complications   Post-procedure details: wound care instructions given    Viral warts, unspecified type L second digit tip and subunguial x 1  Viral Wart (HPV) Counseling with  fissure of the finger tip Discussed viral / HPV (Human Papilloma Virus) etiology and risk of spread /infectivity to other areas of body as well as to other people.  Multiple treatments and methods may be required to clear warts and it is possible treatment may not be successful.  Treatment risks include discoloration; scarring and there is still potential for wart recurrence.  For the fissured area of the finger associated with the wart, start Clobetasol 0.05% ointment to aa BID PRN. Topical steroids (such as triamcinolone, fluocinolone, fluocinonide, mometasone, clobetasol, halobetasol, betamethasone, hydrocortisone) can cause thinning and lightening of the skin if they are used for too long in the same area. Your physician has selected the right strength medicine for your problem and area affected on the body. Please use your medication only as directed by your physician to prevent side effects.   If persistent problems in the area may consider biopsy to rule out SCC.  At this time were not really concerned about this.  Dr. Katrinka Blazing evaluated the patient with me and agrees.  Destruction of lesion - L second digit tip and subunguial x 1 Complexity: simple   Destruction method: cryotherapy   Informed consent: discussed and consent obtained   Timeout:  patient name, date of birth, surgical site, and procedure verified Lesion destroyed using liquid nitrogen: Yes   Region frozen until ice ball extended beyond lesion: Yes   Outcome: patient tolerated procedure well with no complications   Post-procedure details: wound  care instructions given    Destruction of lesion - L second digit tip and subunguial x 1  Destruction method: chemical removal   Informed consent: discussed and consent obtained   Timeout:  patient name, date of birth, surgical site, and procedure verified Chemical destruction method: cantharidin   Chemical destruction method comment:  Cantharidin plus, squaric acid 3% Application  time:  4 hours Procedure instructions: patient instructed to wash and dry area   Outcome: patient tolerated procedure well with no complications   Post-procedure details: wound care instructions given   Additional details:  Patient advised to set alarm to remind them to wash off with soap and water at the directed time.  ACTINIC DAMAGE - chronic, secondary to cumulative UV radiation exposure/sun exposure over time - diffuse scaly erythematous macules with underlying dyspigmentation - Recommend daily broad spectrum sunscreen SPF 30+ to sun-exposed areas, reapply every 2 hours as needed.  - Recommend staying in the shade or wearing long sleeves, sun glasses (UVA+UVB protection) and wide brim hats (4-inch brim around the entire circumference of the hat). - Call for new or changing lesions.  SEBORRHEIC KERATOSIS - Stuck-on, waxy, tan-brown papules and/or plaques  - Benign-appearing - Discussed benign etiology and prognosis. - Observe - Call for any changes  HEMANGIOMA - abdomen Exam: red papule(s) Discussed benign nature. Recommend observation. Call for changes.  Return in about 6 months (around 05/08/2023) for TBSE.  Maylene Roes, CMA, am acting as scribe for Armida Sans, MD .   Documentation: I have reviewed the above documentation for accuracy and completeness, and I agree with the above.  Armida Sans, MD

## 2022-11-08 NOTE — Patient Instructions (Signed)

## 2022-11-17 ENCOUNTER — Encounter: Payer: Self-pay | Admitting: Dermatology

## 2022-11-19 ENCOUNTER — Telehealth: Payer: Self-pay | Admitting: Internal Medicine

## 2022-11-19 NOTE — Telephone Encounter (Signed)
Spoke with Morrie Sheldon, RN and she stated that since we did not prescribe the medication the pt would need to reach out to the doctor that prescribed it for administration. Dr. Darrick Huntsman and pt is aware.

## 2022-11-19 NOTE — Telephone Encounter (Signed)
Please call Samuel Barnett to arrange nurse visit Friday morning for Elidard injection

## 2022-12-05 ENCOUNTER — Other Ambulatory Visit: Payer: Self-pay | Admitting: Dermatology

## 2023-03-13 ENCOUNTER — Ambulatory Visit: Payer: No Typology Code available for payment source | Admitting: Dermatology

## 2023-04-01 ENCOUNTER — Other Ambulatory Visit: Payer: Self-pay | Admitting: Internal Medicine

## 2023-04-01 MED ORDER — SILDENAFIL CITRATE 50 MG PO TABS: 50.0000 mg | ORAL_TABLET | Freq: Every day | ORAL | 11 refills | Status: AC | PRN

## 2023-04-01 NOTE — Telephone Encounter (Signed)
 Duplicate request. Is it okay to refill?

## 2023-04-15 ENCOUNTER — Ambulatory Visit (INDEPENDENT_AMBULATORY_CARE_PROVIDER_SITE_OTHER): Admitting: Dermatology

## 2023-04-15 ENCOUNTER — Encounter: Payer: Self-pay | Admitting: Dermatology

## 2023-04-15 DIAGNOSIS — W908XXA Exposure to other nonionizing radiation, initial encounter: Secondary | ICD-10-CM

## 2023-04-15 DIAGNOSIS — Z79899 Other long term (current) drug therapy: Secondary | ICD-10-CM

## 2023-04-15 DIAGNOSIS — L578 Other skin changes due to chronic exposure to nonionizing radiation: Secondary | ICD-10-CM

## 2023-04-15 DIAGNOSIS — Z1283 Encounter for screening for malignant neoplasm of skin: Secondary | ICD-10-CM | POA: Diagnosis not present

## 2023-04-15 DIAGNOSIS — L2089 Other atopic dermatitis: Secondary | ICD-10-CM

## 2023-04-15 DIAGNOSIS — L209 Atopic dermatitis, unspecified: Secondary | ICD-10-CM | POA: Diagnosis not present

## 2023-04-15 DIAGNOSIS — Z86018 Personal history of other benign neoplasm: Secondary | ICD-10-CM

## 2023-04-15 DIAGNOSIS — L814 Other melanin hyperpigmentation: Secondary | ICD-10-CM

## 2023-04-15 DIAGNOSIS — D229 Melanocytic nevi, unspecified: Secondary | ICD-10-CM

## 2023-04-15 DIAGNOSIS — Z872 Personal history of diseases of the skin and subcutaneous tissue: Secondary | ICD-10-CM

## 2023-04-15 DIAGNOSIS — Z7189 Other specified counseling: Secondary | ICD-10-CM

## 2023-04-15 DIAGNOSIS — D225 Melanocytic nevi of trunk: Secondary | ICD-10-CM

## 2023-04-15 DIAGNOSIS — Z8619 Personal history of other infectious and parasitic diseases: Secondary | ICD-10-CM

## 2023-04-15 DIAGNOSIS — L821 Other seborrheic keratosis: Secondary | ICD-10-CM

## 2023-04-15 NOTE — Patient Instructions (Addendum)
 For Fissued area of the finger  Start Zoryve sample - apply topically affected area in morning  Start Opzelura cream sample - apply topically to affected area nightly  Continue Clobetasol 0.05 % ointment twice daily on Friday, Saturday and Sunday as needed  Topical steroids (such as triamcinolone, fluocinolone, fluocinonide, mometasone, clobetasol, halobetasol, betamethasone, hydrocortisone) can cause thinning and lightening of the skin if they are used for too long in the same area. Your physician has selected the right strength medicine for your problem and area affected on the body. Please use your medication only as directed by your physician to prevent side effects.      Melanoma ABCDEs  Melanoma is the most dangerous type of skin cancer, and is the leading cause of death from skin disease.  You are more likely to develop melanoma if you: Have light-colored skin, light-colored eyes, or red or blond hair Spend a lot of time in the sun Tan regularly, either outdoors or in a tanning bed Have had blistering sunburns, especially during childhood Have a close family member who has had a melanoma Have atypical moles or large birthmarks  Early detection of melanoma is key since treatment is typically straightforward and cure rates are extremely high if we catch it early.   The first sign of melanoma is often a change in a mole or a new dark spot.  The ABCDE system is a way of remembering the signs of melanoma.  A for asymmetry:  The two halves do not match. B for border:  The edges of the growth are irregular. C for color:  A mixture of colors are present instead of an even brown color. D for diameter:  Melanomas are usually (but not always) greater than 6mm - the size of a pencil eraser. E for evolution:  The spot keeps changing in size, shape, and color.  Please check your skin once per month between visits. You can use a small mirror in front and a large mirror behind you to keep an eye  on the back side or your body.   If you see any new or changing lesions before your next follow-up, please call to schedule a visit.  Please continue daily skin protection including broad spectrum sunscreen SPF 30+ to sun-exposed areas, reapplying every 2 hours as needed when you're outdoors.   Staying in the shade or wearing long sleeves, sun glasses (UVA+UVB protection) and wide brim hats (4-inch brim around the entire circumference of the hat) are also recommended for sun protection.    Due to recent changes in healthcare laws, you may see results of your pathology and/or laboratory studies on MyChart before the doctors have had a chance to review them. We understand that in some cases there may be results that are confusing or concerning to you. Please understand that not all results are received at the same time and often the doctors may need to interpret multiple results in order to provide you with the best plan of care or course of treatment. Therefore, we ask that you please give [place] 2 business days to thoroughly review all your results before contacting the office for clarification. Should we see a critical lab result, you will be contacted sooner.   If You Need Anything After Your Visit  If you have any questions or concerns for your doctor, please call our main line at [phone] and press option 4 to reach your doctor's medical assistant. If no one answers, please leave a voicemail as  directed and we will return your call as soon as possible. Messages left after 4 pm will be answered the following business day.   You may also send [place] a message via MyChart. We typically respond to MyChart messages within 1-2 business days.  For prescription refills, please ask your pharmacy to contact our office. Our fax number is [phone].  If you have an urgent issue when the clinic is closed that cannot wait until the next business day, you can page your doctor at the number below.    Please  note that while we do our best to be available for urgent issues outside of office hours, we are not available 24/7.   If you have an urgent issue and are unable to reach [place], you may choose to seek medical care at your doctor's office, retail clinic, urgent care center, or emergency room.  If you have a medical emergency, please immediately call 911 or go to the emergency department.  Pager Numbers  - Dr. Gwen Pounds: [phone]  - Dr. Roseanne Reno: [phone]  - Dr. Katrinka Blazing: [phone]   In the event of inclement weather, please call our main line at [phone] for an update on the status of any delays or closures.  Dermatology Medication Tips: Please keep the boxes that topical medications come in in order to help keep track of the instructions about where and how to use these. Pharmacies typically print the medication instructions only on the boxes and not directly on the medication tubes.   If your medication is too expensive, please contact our office at [phone] option 4 or send [place] a message through MyChart.   We are unable to tell what your co-pay for medications will be in advance as this is different depending on your insurance coverage. However, we may be able to find a substitute medication at lower cost or fill out paperwork to get insurance to cover a needed medication.   If a prior authorization is required to get your medication covered by your insurance company, please allow [place] 1-2 business days to complete this process.  Drug prices often vary depending on where the prescription is filled and some pharmacies may offer cheaper prices.  The website www.goodrx.com contains coupons for medications through different pharmacies. The prices here do not account for what the cost may be with help from insurance (it may be cheaper with your insurance), but the website can give you the price if you did not use any insurance.  - You can print the associated coupon and take it with  your prescription to the pharmacy.  - You may also stop by our office during regular business hours and pick up a GoodRx coupon card.  - If you need your prescription sent electronically to a different pharmacy, notify our office through [facility] or by phone at [phone] option 4.     Si Usted Necesita Algo Despus de Su Visita  Tambin puede enviarnos un mensaje a travs de Clinical cytogeneticist. Por lo general respondemos a los mensajes de MyChart en el transcurso de 1 a 2 das hbiles.  Para renovar recetas, por favor pida a su farmacia que se ponga en contacto con nuestra oficina. Annie Sable de fax es [place] [phone].  Si tiene un asunto urgente cuando la clnica est cerrada y que no puede esperar hasta el siguiente da hbil, puede llamar/localizar a su doctor(a) al nmero que aparece a continuacin.   Por favor, tenga en cuenta que aunque hacemos todo lo posible  para estar disponibles para asuntos urgentes fuera del horario de [place], no estamos disponibles las 24 horas del da, los 7 [address]  [address] de la [place].   Si tiene un problema urgente y no puede comunicarse con nosotros, puede optar por buscar atencin mdica  en el consultorio de su doctor(a), en una clnica privada, en un centro de atencin urgente o en una sala de emergencias.  Si tiene Engineer, drilling, por favor llame inmediatamente al 911 o vaya a la sala de emergencias.  Nmeros de bper  - Dr. Bary Likes: [phone]  - Dra. Annette Barters: [phone]  - Dr. Felipe Horton: [phone]   En caso de inclemencias del tiempo, por favor llame a Lajuan Pila principal al [phone] para una actualizacin sobre el [place] de cualquier retraso o cierre.  Consejos para la medicacin en dermatologa: Por favor, guarde las cajas en las que vienen los medicamentos de uso tpico para ayudarle a seguir las instrucciones sobre dnde y cmo usarlos. Las farmacias generalmente imprimen las instrucciones del medicamento slo en las cajas y  no directamente en los tubos del [place].   Si su medicamento es muy caro, por favor, pngase en contacto con Bettyjane Brunet llamando al [phone] y presione la opcin 4 o envenos un mensaje a travs de Clinical cytogeneticist.   No podemos decirle cul ser su copago por los medicamentos por adelantado ya que esto es diferente dependiendo de la cobertura de su seguro. Sin embargo, es posible que podamos encontrar un medicamento sustituto a Audiological scientist un formulario para que el seguro cubra el medicamento que se considera necesario.   Si se requiere una autorizacin previa para que su compaa de seguros [place] su medicamento, por favor permtanos de 1 a 2 das hbiles para completar este proceso.  Los precios de los medicamentos varan con frecuencia dependiendo del Environmental consultant de dnde se surte la receta y alguna farmacias pueden ofrecer precios ms baratos.  El sitio web www.goodrx.com tiene cupones para medicamentos de Health and safety inspector. Los precios aqu no tienen en cuenta lo que podra costar con la ayuda del seguro (puede ser ms barato con su seguro), pero el sitio web puede darle el precio si no utiliz Tourist information centre manager.  - Puede imprimir el cupn correspondiente y llevarlo con su receta a la farmacia.  - Tambin puede pasar por nuestra oficina durante el horario de atencin regular y Education officer, museum una tarjeta de cupones de GoodRx.  - Si necesita que su receta se enve electrnicamente a una farmacia diferente, informe a nuestra oficina a travs de MyChart de [place] o por telfono llamando al [phone] y presione la opcin 4.

## 2023-04-15 NOTE — Progress Notes (Signed)
 Follow-Up Visit   Subjective  Samuel Barnett is a 68 y.o. male who presents for the following: Skin Cancer Screening and Full Body Skin Exam Hx of isks, hx of dysplastic nevi, hx of wart at right 3rd digit.  Patient reports still some dry scale at right 2nd digit.   The patient presents for Total-Body Skin Exam (TBSE) for skin cancer screening and mole check. The patient has spots, moles and lesions to be evaluated, some may be new or changing and the patient may have concern these could be cancer.  The following portions of the chart were reviewed this encounter and updated as appropriate: medications, allergies, medical history  Review of Systems:  No other skin or systemic complaints except as noted in HPI or Assessment and Plan.  Objective  Well appearing patient in no apparent distress; mood and affect are within normal limits.  A full examination was performed including scalp, head, eyes, ears, nose, lips, neck, chest, axillae, abdomen, back, buttocks, bilateral upper extremities, bilateral lower extremities, hands, feet, fingers, toes, fingernails, and toenails. All findings within normal limits unless otherwise noted below.   Relevant physical exam findings are noted in the Assessment and Plan.       Assessment & Plan   SKIN CANCER SCREENING PERFORMED TODAY.  ACTINIC DAMAGE - Chronic condition, secondary to cumulative UV/sun exposure - diffuse scaly erythematous macules with underlying dyspigmentation - Recommend daily broad spectrum sunscreen SPF 30+ to sun-exposed areas, reapply every 2 hours as needed.  - Staying in the shade or wearing long sleeves, sun glasses (UVA+UVB protection) and wide brim hats (4-inch brim around the entire circumference of the hat) are also recommended for sun protection.  - Call for new or changing lesions.  LENTIGINES, SEBORRHEIC KERATOSES, HEMANGIOMAS - Benign normal skin lesions - Benign-appearing - Call for any  changes  MELANOCYTIC NEVI - Tan-brown and/or pink-flesh-colored symmetric macules and papules - Benign appearing on exam today - Observation - Call clinic for new or changing moles - Recommend daily use of broad spectrum spf 30+ sunscreen to sun-exposed areas. 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 Condition is bothersome/symptomatic for patient. Currently flared after washing cars today. Atopic dermatitis (eczema) is a chronic, relapsing, pruritic condition that can significantly affect quality of life. It is often associated with allergic rhinitis and/or asthma and can require treatment with topical medications, phototherapy, or in severe cases biologic injectable medication (Dupixent; Adbry) or Oral JAK inhibitors. Treatment Plan: Recommend starting Zoryve Cream 0.3 % (samples given) apply topically to affected areas qam Recommend starting Opzelura Cream (samples given) apply topically to affected areas qhs  Can continue clobetasol ointment 0.05 % apply topically to affected area as needed 3 days a week on Friday, Saturday, Sundays when flared  Avoid applying to face, groin, and axilla. Use as directed. Long-term use can cause thinning of the skin. Topical steroids (such as triamcinolone, fluocinolone, fluocinonide, mometasone, clobetasol, halobetasol, betamethasone, hydrocortisone) can cause thinning and lightening of the skin if they are used for too long in the same area. Your physician has selected the right strength medicine for your problem and area affected on the body. Please use your medication only as directed by your physician to prevent side effects.   Recommend gentle skin  care.  HISTORY OF DYSPLASTIC NEVUS 11/18/2014  left superior  pectoral - mild atypia, limited margins free  No evidence of recurrence today Recommend regular full body skin exams Recommend daily broad spectrum sunscreen SPF 30+ to sun-exposed areas, reapply every 2 hours as needed.  Call if any new or changing lesions are noted between office visits  Hx of viral WART - now resolved Right 3rd finger tip and subungual Exam: Clear today at exam at right 3rd finger tip Counseling Discussed viral / HPV (Human Papilloma Virus) etiology and risk of spread /infectivity to other areas of body as well as to other people.  Multiple treatments and methods may be required to clear warts and it is possible treatment may not be successful.  Treatment risks include discoloration; scarring and there is still potential for wart recurrence. Treatment Plan: Viral Wart (HPV) Counseling  Discussed viral / HPV (Human Papilloma Virus) etiology and risk of spread /infectivity to other areas of body as well as to other people.  Multiple treatments and methods may be required to clear warts and it is possible treatment may not be successful.  Treatment risks include discoloration; scarring and there is still potential for wart recurrence. No recommended treatment since resolved.  May recur.  Observe.  OTHER ATOPIC DERMATITIS   COUNSELING AND COORDINATION OF CARE   MEDICATION MANAGEMENT   ACTINIC SKIN DAMAGE   HISTORY OF DYSPLASTIC NEVUS   SKIN CANCER SCREENING   LENTIGO   MELANOCYTIC NEVUS, UNSPECIFIED LOCATION   Return for 1 year tbse.  IRandee Busing, CMA, am acting as scribe for Celine Collard, MD.   Documentation: I have reviewed the above documentation for accuracy and completeness, and I agree with the above.  Celine Collard, MD

## 2023-05-02 ENCOUNTER — Ambulatory Visit: Payer: No Typology Code available for payment source | Admitting: Dermatology

## 2023-05-16 ENCOUNTER — Other Ambulatory Visit: Payer: Self-pay | Admitting: Internal Medicine

## 2023-05-16 ENCOUNTER — Telehealth: Payer: Self-pay | Admitting: Internal Medicine

## 2023-05-16 DIAGNOSIS — I10 Essential (primary) hypertension: Secondary | ICD-10-CM | POA: Insufficient documentation

## 2023-05-16 MED ORDER — AMLODIPINE BESYLATE 2.5 MG PO TABS: 2.5000 mg | ORAL_TABLET | Freq: Every day | ORAL | 1 refills | Status: DC

## 2023-05-16 NOTE — Assessment & Plan Note (Signed)
 Noted by wife at home .  Starting amlodipine 2.5 mg

## 2023-06-12 ENCOUNTER — Other Ambulatory Visit (INDEPENDENT_AMBULATORY_CARE_PROVIDER_SITE_OTHER)

## 2023-06-12 DIAGNOSIS — I10 Essential (primary) hypertension: Secondary | ICD-10-CM

## 2023-06-12 LAB — COMPREHENSIVE METABOLIC PANEL WITH GFR
ALT: 16 U/L (ref 0–53)
AST: 18 U/L (ref 0–37)
Albumin: 4.7 g/dL (ref 3.5–5.2)
Alkaline Phosphatase: 47 U/L (ref 39–117)
BUN: 15 mg/dL (ref 6–23)
CO2: 28 meq/L (ref 19–32)
Calcium: 9.4 mg/dL (ref 8.4–10.5)
Chloride: 103 meq/L (ref 96–112)
Creatinine, Ser: 0.9 mg/dL (ref 0.40–1.50)
GFR: 87.97 mL/min (ref >60.00)
Glucose, Bld: 103 mg/dL — ABNORMAL HIGH (ref 70–99)
Potassium: 3.9 meq/L (ref 3.5–5.1)
Sodium: 140 meq/L (ref 135–145)
Total Bilirubin: 0.7 mg/dL (ref 0.2–1.2)
Total Protein: 7.5 g/dL (ref 6.0–8.3)

## 2023-06-12 LAB — MICROALBUMIN / CREATININE URINE RATIO
Creatinine,U: 142.3 mg/dL
Microalb Creat Ratio: 7.1 mg/g (ref 0.0–30.0)
Microalb, Ur: 1 mg/dL (ref 0.0–1.9)

## 2023-06-13 LAB — LIPID PANEL W/REFLEX DIRECT LDL
Cholesterol: 243 mg/dL — ABNORMAL HIGH (ref <200)
HDL: 89 mg/dL (ref >=40)
LDL Cholesterol (Calc): 136 mg/dL — ABNORMAL HIGH
Non-HDL Cholesterol (Calc): 154 mg/dL — ABNORMAL HIGH (ref <130)
Total CHOL/HDL Ratio: 2.7 (calc) (ref <5.0)
Triglycerides: 81 mg/dL (ref <150)

## 2023-06-14 ENCOUNTER — Ambulatory Visit (INDEPENDENT_AMBULATORY_CARE_PROVIDER_SITE_OTHER): Admitting: Internal Medicine

## 2023-06-14 ENCOUNTER — Encounter: Payer: Self-pay | Admitting: Internal Medicine

## 2023-06-14 VITALS — BP 128/77 | HR 75 | Ht 73.5 in | Wt 199.0 lb

## 2023-06-14 DIAGNOSIS — Z23 Encounter for immunization: Secondary | ICD-10-CM

## 2023-06-14 DIAGNOSIS — E7800 Pure hypercholesterolemia, unspecified: Secondary | ICD-10-CM | POA: Diagnosis not present

## 2023-06-14 DIAGNOSIS — Z0000 Encounter for general adult medical examination without abnormal findings: Secondary | ICD-10-CM

## 2023-06-14 DIAGNOSIS — I700 Atherosclerosis of aorta: Secondary | ICD-10-CM | POA: Diagnosis not present

## 2023-06-14 DIAGNOSIS — I10 Essential (primary) hypertension: Secondary | ICD-10-CM

## 2023-06-14 DIAGNOSIS — C61 Malignant neoplasm of prostate: Secondary | ICD-10-CM | POA: Diagnosis not present

## 2023-06-14 DIAGNOSIS — E785 Hyperlipidemia, unspecified: Secondary | ICD-10-CM | POA: Insufficient documentation

## 2023-06-14 NOTE — Assessment & Plan Note (Signed)
 Untreated despite AHA risk calculation of 15% given zero CAC score in 2022  Lab Results  Component Value Date   CHOL 243 (H) 06/12/2023   HDL 89 06/12/2023   LDLCALC 136 (H) 06/12/2023   LDLDIRECT 122.0 10/09/2022   TRIG 81 06/12/2023   CHOLHDL 2.7 06/12/2023

## 2023-06-14 NOTE — Assessment & Plan Note (Addendum)
 RECURRENCE managed with cyberknife 5 sessions, FOLLOW UP IN in May last PSA was LOWER AT 3.19.   to be checked every 3 months , then every 6

## 2023-06-14 NOTE — Patient Instructions (Signed)
 Congratulations !  ( On many levels)   Continue monitoring BP and increase amlodipine  to 5 mg daily if your  readings become >130/80  One you get moved, send me the info on the pharmacy you want your meds transferred to  You can also get labs done at a [facility] draw station near you too, prior to your next follow up in 6 months

## 2023-06-14 NOTE — Progress Notes (Unsigned)
 Subjective:  Patient ID: Samuel Barnett, male    DOB: 1955-09-29  Age: 68 y.o. MRN: [id number]  CC: There were no encounter diagnoses.   HPI Samuel Barnett presents for  Chief Complaint  Patient presents with   Medical Management of Chronic Issues      Outpatient Medications Prior to Visit  Medication Sig Dispense Refill   amLODipine  (NORVASC ) 2.5 MG tablet Take 1 tablet (2.5 mg total) by mouth daily. 90 tablet 1   clobetasol  ointment (TEMOVATE ) 0.05 % APPLY TO AFFECTED AREA, FINGER TIP,ONCE DAILY TO TWO TIMES A DAY AS NEEDED AVOID APPLYING TO FACE GROIN AND AXILLA. USE AS DIRECTED, LONG-TERM USE CAN CAUSE SKIN THINNING 30 g 0   Nutritional Supplements (JUICE PLUS FIBRE PO) Take 6 capsules by mouth daily.     sildenafil  (VIAGRA ) 50 MG tablet Take 1 tablet (50 mg total) by mouth daily as needed for erectile dysfunction. 10 tablet 11   No facility-administered medications prior to visit.    Review of Systems;  Patient denies headache, fevers, malaise, unintentional weight loss, skin rash, eye pain, sinus congestion and sinus pain, sore throat, dysphagia,  hemoptysis , cough, dyspnea, wheezing, chest pain, palpitations, orthopnea, edema, abdominal pain, nausea, melena, diarrhea, constipation, flank pain, dysuria, hematuria, urinary  Frequency, nocturia, numbness, tingling, seizures,  Focal weakness, Loss of consciousness,  Tremor, insomnia, depression, anxiety, and suicidal ideation.      Objective:  BP 128/77   Pulse 75   Ht 6' 1.5 (1.867 m)   Wt 199 lb (90.3 kg)   SpO2 98%   BMI 25.90 kg/m   BP Readings from Last 3 Encounters:  06/14/23 128/77  10/09/22 (!) 142/80  08/02/22 138/77    Wt Readings from Last 3 Encounters:  06/14/23 199 lb (90.3 kg)  10/09/22 202 lb (91.6 kg)  04/25/22 204 lb (92.5 kg)    Physical Exam  Lab Results  Component Value Date   HGBA1C 5.6 10/09/2022   HGBA1C 5.9 12/07/2021    Lab Results  Component Value Date   CREATININE 0.90  06/12/2023   CREATININE 0.91 10/09/2022   CREATININE 1.03 06/07/2022    Lab Results  Component Value Date   WBC 5.9 10/09/2022   HGB 14.1 10/09/2022   HCT 42.8 10/09/2022   PLT 284.0 10/09/2022   GLUCOSE 103 (H) 06/12/2023   CHOL 243 (H) 06/12/2023   TRIG 81 06/12/2023   HDL 89 06/12/2023   LDLDIRECT 122.0 10/09/2022   LDLCALC 136 (H) 06/12/2023   ALT 16 06/12/2023   AST 18 06/12/2023   NA 140 06/12/2023   K 3.9 06/12/2023   CL 103 06/12/2023   CREATININE 0.90 06/12/2023   BUN 15 06/12/2023   CO2 28 06/12/2023   TSH 1.40 10/09/2022   PSA 6.49 (H) 10/09/2022   HGBA1C 5.6 10/09/2022   MICROALBUR 1.0 06/12/2023    MR PROSTATE W WO CONTRAST Result Date: 04/28/2022 CLINICAL DATA:  Prostate cancer. C61. Gleason 4+3=7 prostate adenocarcinoma at previous region of interest # 1. Prior high food. Prior neoadjuvant therapy and adjuvant ADT. Rising PSA. Posterior prostate PMSA activity on a PMSA PET scan from 03/29/2022 performed at [facility]. EXAM: MR PROSTATE WITHOUT AND WITH CONTRAST TECHNIQUE: Multiplanar multisequence MRI images were obtained of the pelvis centered about the prostate. Pre and post contrast images were obtained. CONTRAST:  10mL GADAVIST  GADOBUTROL  1 MMOL/ML IV SOLN COMPARISON:  Prostate MRI 06/02/2020 FINDINGS: Prostate: Marked reduction in volume of the prostate gland compared to  the prior prostate MRI. Hazy low T2 signal in the peripheral zone is nonfocal, likely postinflammatory, and is considered PI-RADS category 2. Region of interest # 1: PI-RADS category 2 lesion of the right anterior peripheral zone in the mid gland and apex with involvement of the anterior fibromuscular stroma and anterior transition zone at the apex, and involvement of the right posterolateral peripheral zone in the mid gland, with focally reduced T2 signal (image 36, series 10) without substantial reduction in ADC map activity, and without substantial restricted diffusion. This  measures 1.58 cc (2.1 by 0.8 by 1.3 cm) and partially encompasses the previous region of interest # 1. Currently no restricted diffusion is identified in the prostate gland to indicate active tumor. No obvious abnormal region of early enhancement. Volume: 3D volumetric analysis: Prostate volume 26.41 cc (4.6 by 3.3 by 3.8 cm). Transcapsular spread:  Absent Seminal vesicle involvement: Absent Neurovascular bundle involvement: Absent Pelvic adenopathy: Absent Bone metastasis: Absent Other findings: Trace free pelvic fluid, image 29 series 5. IMPRESSION: 1. Marked reduction in volume of the prostate gland compared to the prior prostate MRI. 2. PI-RADS category 2 lesion of the right anterior and posterolateral peripheral zone in the mid gland and apex, partially encompassing the previous region of interest # 1. Currently no restricted diffusion is identified to indicate active tumor. 3. Trace free pelvic fluid. Electronically Signed   By: Freida Jes M.D.   On: 04/28/2022 10:52    Assessment & Plan:  .There are no diagnoses linked to this encounter.   I spent 34 minutes on the day of this face to face encounter reviewing patient's  most recent visit with cardiology,  nephrology,  and neurology,  prior relevant surgical and non surgical procedures, recent  labs and imaging studies, counseling on weight management,  reviewing the assessment and plan with patient, and post visit ordering and reviewing of  diagnostics and therapeutics with patient  .   Follow-up: No follow-ups on file.   Thersia Flax, MD

## 2023-06-16 NOTE — Assessment & Plan Note (Signed)
 Tolerating amlodipine  2.5 mg . The home BP readings have been in the 128's / 74 range. NO CHANGES TODAY

## 2023-06-16 NOTE — Assessment & Plan Note (Signed)
 Incidental finding on Abd CT. Aortoiliac disease.  No claudication symptoms.  CAC score was ero.  No indication for therapy   Lab Results  Component Value Date   CHOL 243 (H) 06/12/2023   HDL 89 06/12/2023   LDLCALC 136 (H) 06/12/2023   LDLDIRECT 122.0 10/09/2022   TRIG 81 06/12/2023   CHOLHDL 2.7 06/12/2023

## 2023-06-16 NOTE — Assessment & Plan Note (Signed)

## 2023-09-24 ENCOUNTER — Other Ambulatory Visit: Payer: Self-pay | Admitting: Internal Medicine

## 2023-09-24 MED ORDER — AMLODIPINE BESYLATE 2.5 MG PO TABS: 2.5000 mg | ORAL_TABLET | Freq: Every day | ORAL | 1 refills | Status: DC

## 2023-11-05 ENCOUNTER — Telehealth: Payer: Self-pay | Admitting: Internal Medicine

## 2023-11-05 DIAGNOSIS — E785 Hyperlipidemia, unspecified: Secondary | ICD-10-CM

## 2023-11-05 DIAGNOSIS — R7301 Impaired fasting glucose: Secondary | ICD-10-CM

## 2023-11-05 DIAGNOSIS — R5383 Other fatigue: Secondary | ICD-10-CM

## 2023-11-05 NOTE — Telephone Encounter (Signed)
 Labs have been pended for approval. Please let me know if needed so we can schedule a lab appt.

## 2023-11-05 NOTE — Telephone Encounter (Signed)
 Pt is asking about whether or not he will need a physical since he is coming in for the [facility] appt in December. Last CPE was 08/2022. Also asking about getting bloodwork done. Has no orders in currently, would like to come in for 12/1 when wife is scheduled to be here for hers.

## 2023-11-05 NOTE — Addendum Note (Signed)
 Addended by: MARYLYNN VERNEITA CROME on: 11/05/2023 09:16 PM   Modules accepted: Orders

## 2023-11-05 NOTE — Addendum Note (Signed)
 Addended by: HARRIETTE RAISIN on: 11/05/2023 02:06 PM   Modules accepted: Orders

## 2023-11-06 NOTE — Telephone Encounter (Signed)
 noted

## 2023-11-06 NOTE — Telephone Encounter (Signed)
 Pt needs to be scheduled for a lab appt before his next appt with Dr. Tullo

## 2023-12-02 ENCOUNTER — Other Ambulatory Visit

## 2023-12-02 DIAGNOSIS — E785 Hyperlipidemia, unspecified: Secondary | ICD-10-CM | POA: Diagnosis not present

## 2023-12-02 DIAGNOSIS — R7301 Impaired fasting glucose: Secondary | ICD-10-CM

## 2023-12-02 DIAGNOSIS — R5383 Other fatigue: Secondary | ICD-10-CM

## 2023-12-02 LAB — CBC WITH DIFFERENTIAL/PLATELET
Basophils Absolute: 0 K/uL (ref 0.0–0.1)
Basophils Relative: 0.7 % (ref 0.0–3.0)
Eosinophils Absolute: 0.1 K/uL (ref 0.0–0.7)
Eosinophils Relative: 1.5 % (ref 0.0–5.0)
HCT: 39.6 % (ref 39.0–52.0)
Hemoglobin: 13.8 g/dL (ref 13.0–17.0)
Lymphocytes Relative: 32.7 % (ref 12.0–46.0)
Lymphs Abs: 1.8 K/uL (ref 0.7–4.0)
MCHC: 34.8 g/dL (ref 30.0–36.0)
MCV: 96.3 fl (ref 78.0–100.0)
Monocytes Absolute: 0.5 K/uL (ref 0.1–1.0)
Monocytes Relative: 9 % (ref 3.0–12.0)
Neutro Abs: 3.2 K/uL (ref 1.4–7.7)
Neutrophils Relative %: 56.1 % (ref 43.0–77.0)
Platelets: 252 K/uL (ref 150.0–400.0)
RBC: 4.11 Mil/uL — ABNORMAL LOW (ref 4.22–5.81)
RDW: 14.1 % (ref 11.5–15.5)
WBC: 5.7 K/uL (ref 4.0–10.5)

## 2023-12-02 LAB — COMPREHENSIVE METABOLIC PANEL WITH GFR
ALT: 14 U/L (ref 0–53)
AST: 14 U/L (ref 0–37)
Albumin: 4.5 g/dL (ref 3.5–5.2)
Alkaline Phosphatase: 42 U/L (ref 39–117)
BUN: 14 mg/dL (ref 6–23)
CO2: 29 meq/L (ref 19–32)
Calcium: 9.1 mg/dL (ref 8.4–10.5)
Chloride: 104 meq/L (ref 96–112)
Creatinine, Ser: 0.94 mg/dL (ref 0.40–1.50)
GFR: 83.22 mL/min (ref >60.00)
Glucose, Bld: 92 mg/dL (ref 70–99)
Potassium: 3.8 meq/L (ref 3.5–5.1)
Sodium: 140 meq/L (ref 135–145)
Total Bilirubin: 0.6 mg/dL (ref 0.2–1.2)
Total Protein: 6.7 g/dL (ref 6.0–8.3)

## 2023-12-02 LAB — LIPID PANEL
Cholesterol: 212 mg/dL — ABNORMAL HIGH (ref 0–200)
HDL: 68.7 mg/dL (ref >39.00)
LDL Cholesterol: 131 mg/dL — ABNORMAL HIGH (ref 0–99)
NonHDL: 143.72
Total CHOL/HDL Ratio: 3
Triglycerides: 65 mg/dL (ref 0.0–149.0)
VLDL: 13 mg/dL (ref 0.0–40.0)

## 2023-12-02 LAB — HEMOGLOBIN A1C: Hgb A1c MFr Bld: 5.6 % (ref 4.6–6.5)

## 2023-12-02 LAB — LDL CHOLESTEROL, DIRECT: Direct LDL: 119 mg/dL

## 2023-12-02 LAB — TSH: TSH: 2.51 u[IU]/mL (ref 0.35–5.50)

## 2023-12-03 ENCOUNTER — Encounter: Admitting: Internal Medicine

## 2023-12-03 ENCOUNTER — Ambulatory Visit: Payer: Self-pay | Admitting: Internal Medicine

## 2023-12-04 ENCOUNTER — Ambulatory Visit: Admitting: Internal Medicine

## 2023-12-04 ENCOUNTER — Telehealth: Payer: Self-pay

## 2023-12-04 ENCOUNTER — Encounter: Payer: Self-pay | Admitting: Internal Medicine

## 2023-12-04 VITALS — BP 138/84 | HR 74 | Ht 73.5 in | Wt 206.8 lb

## 2023-12-04 DIAGNOSIS — I10 Essential (primary) hypertension: Secondary | ICD-10-CM

## 2023-12-04 DIAGNOSIS — Z0000 Encounter for general adult medical examination without abnormal findings: Secondary | ICD-10-CM

## 2023-12-04 DIAGNOSIS — I700 Atherosclerosis of aorta: Secondary | ICD-10-CM

## 2023-12-04 DIAGNOSIS — Z23 Encounter for immunization: Secondary | ICD-10-CM

## 2023-12-04 DIAGNOSIS — E782 Mixed hyperlipidemia: Secondary | ICD-10-CM

## 2023-12-04 NOTE — Progress Notes (Unsigned)
 The patient is here for the Welcome to  Medicare  preventive visit     has a past medical history of Arthritis, Dysplastic nevus (11/18/2014), Elevated PSA, GERD (gastroesophageal reflux disease), Hemorrhoids, History of gastroesophageal reflux (GERD), Prostate CA (04/05/2020), Prostate CA (HCC) (03/2021), Seasonal allergies, and Spermatocele.    reports that he has never smoked. He has never used smokeless tobacco. He reports that he does not currently use alcohol. He reports that he does not use drugs.   The roster of all physicians providing medical care to patient : Patient Care Team: Marylynn Verneita CROME, MD as PCP - General (Internal Medicine)  Activities of daily living:  The patient is 100% independent in all ADLs: dressing, toileting, feeding as well as independent mobility Fall risk was assessed by direct patient evaluation of patient's balance, gait and ability to risk from a chair and from a kneeling position. Home safety : The patient has smoke detectors in the home. They wear seatbelts.  There are no firearms at home. There is no violence in the home.  Patient has seen their eye doctor in the last year.   Visual acuity was assessed today  and was 20/20 with correction lenses. Patient denies hearing difficulty with regard to whispered voices and some television programs and has  deferred audiologic testing in the last year.   There is no risks for hepatitis, STDs or HIV. There is no   history of blood transfusion. They have no travel history to infectious disease endemic areas of the world.  The patient has seen their dentist in the last six month.  They do not  have excessive sun exposure. Discussed the need for sun protection: hats, long sleeves and use of sunscreen if there is significant sun exposure.   Diet: the importance of a healthy diet is discussed. They do have a healthy diet.  The benefits of regular aerobic exercise were discussed. Patient walks 4 times per week ,  a  minimum of 20 minutes.   Depression screen:      12/04/2023    3:25 PM 06/14/2023    2:41 PM 10/09/2022    9:58 AM 12/11/2021   10:29 AM 08/31/2020   11:23 AM  Depression screen PHQ 2/9  Decreased Interest 0 0 0 0 0  Down, Depressed, Hopeless 0 0 0 0 0  PHQ - 2 Score 0 0 0 0 0  Altered sleeping    0   Tired, decreased energy    0   Change in appetite    0   Feeling bad or failure about yourself     0   Trouble concentrating    0   Moving slowly or fidgety/restless    0   Suicidal thoughts    0   PHQ-9 Score    0    Difficult doing work/chores    Not difficult at all      Data saved with a previous flowsheet row definition       Cognitive assessment: the patient manages all their financial and personal affairs and is actively engaged. They could relate day,date,year and events; recalled 2/3 objects at 3 minutes; performed clock-face test normally.  The following portions of the patient's history were reviewed and updated as appropriate: allergies, current medications, past family history, past medical history,  past surgical history, past social history  and problem list.  During the course of the visit the patient was educated and counseled about appropriate screening and preventive services  including : fall prevention , diabetes screening, nutrition counseling, colorectal cancer screening, and recommended immunizations   Immunization History  Administered Date(s) Administered   Fluad Quad(high Dose 65+) 08/31/2020, 12/07/2021   Fluad Trivalent(High Dose 65+) 10/09/2022   Janssen (J&J) SARS-COV-2 Vaccination 04/09/2019   PNEUMOCOCCAL CONJUGATE-20 06/14/2023   Tdap 08/31/2020   Zoster Recombinant(Shingrix) 12/06/2020  HMLISTPATIENTFRIENDLY@ Health Maintenance Due  Topic Date Due   Medicare Annual Wellness (AWV)  Never done   COVID-19 Vaccine (2 - Janssen risk series) 05/07/2019   Zoster Vaccines- Shingrix (2 of 2) 01/31/2021   Influenza Vaccine  08/02/2023    Last  skin cancer screening :      Vital Signs: BP 138/84   Pulse 74   Ht 6' 1.5 (1.867 m)   Wt 206 lb 12.8 oz (93.8 kg)   SpO2 97%   BMI 26.91 kg/m    Exam: General appearance: alert, cooperative and appears stated age Head: Normocephalic, without obvious abnormality, atraumatic Eyes: conjunctivae/corneas clear. PERRL, EOM's intact. Fundi benign. Ears: normal TM's and external ear canals both ears Nose: Nares normal. Septum midline. Mucosa normal. No drainage or sinus tenderness. Throat: lips, mucosa, and tongue normal; teeth and gums normal Neck: no adenopathy, no carotid bruit, no JVD, supple, symmetrical, trachea midline and thyroid  not enlarged, symmetric, no tenderness/mass/nodules Lungs: clear to auscultation bilaterally Breasts: normal appearance, no masses or tenderness Heart: regular rate and rhythm, S1, S2 normal, no murmur, click, rub or gallop Abdomen: soft, non-tender; bowel sounds normal; no masses,  no organomegaly Extremities: extremities normal, atraumatic, no cyanosis or edema Pulses: 2+ and symmetric Skin: Skin color, texture, turgor normal. No rashes or lesions Neurologic: Alert and oriented X 3, normal strength and tone. Normal symmetric reflexes. Normal coordination and gait.     End of Life Discussion and Planning   During the course of the visit , End of Life objectives were discussed at length.  Patient does not have a living will in place or a healthcare power of attorney.  Patient  was given printed information about advance directives and encouraged to return after discussing with their family.  Review of Opioid Prescriptions    Patient does not have a current opioid prescription*** Patient's risk factors for opioid use disorder was reviewed and includes *** Treatment of pain using non-opioid alternatives was reviewed  and encouraged   Patient risk for potential substance abuse was assessed and addressed with counselling.   Assessment and Plan  No  problem-specific Assessment & Plan notes found for this encounter.

## 2023-12-04 NOTE — Assessment & Plan Note (Addendum)
 10 yr risk of CAD using the AHA risk calculator is However,  Patient has aortic atherosclerosis  and CAC score was zero in 2022.  Samuel Barnett  No treatment advised at this time   Lab Results  Component Value Date   CHOL 212 (H) 12/02/2023   HDL 68.70 12/02/2023   LDLCALC 131 (H) 12/02/2023   LDLDIRECT 119.0 12/02/2023   TRIG 65.0 12/02/2023   CHOLHDL 3 12/02/2023

## 2023-12-04 NOTE — Patient Instructions (Signed)
 YOUR BLOOD PRESSURE IS slightly  high today.   Ideally it should be 120/70   to <130/80   Please check your blood pressure a few times at home and send me the readings so I can determine if you need to increase your amlodipine  dose to 5 mg daily   See you in 6 months; I'll order your labs in advance if you prefer

## 2023-12-04 NOTE — Telephone Encounter (Signed)
 Patient states his wife is coming back to see Dr. Verneita Kettering in six months and he would like to come back in six months too, instead of waiting a whole year.  Patient states he would also like to have labs prior to his six months follow-up.  I scheduled an appointment for patient to see Dr. Kettering on 06/10/2024 and a lab visit on 06/08/2024, but lab orders will need to be entered.

## 2023-12-05 NOTE — Assessment & Plan Note (Signed)
 he reports compliance with medication regimen  but has an elevated reading today in office.  He has been asked to check his BP at work and  submit readings for evaluation. Renal function will be checked today   Lab Results  Component Value Date   NA 140 12/02/2023   K 3.8 12/02/2023   CL 104 12/02/2023   CO2 29 12/02/2023   Lab Results  Component Value Date   CREATININE 0.94 12/02/2023   Lab Results  Component Value Date   MICROALBUR 1.0 06/12/2023

## 2023-12-05 NOTE — Assessment & Plan Note (Signed)
 Incidental finding on Abd CT. Aortoiliac disease.  No claudication symptoms.  CAC score was zero.  No indication for therapy   Lab Results  Component Value Date   CHOL 212 (H) 12/02/2023   HDL 68.70 12/02/2023   LDLCALC 131 (H) 12/02/2023   LDLDIRECT 119.0 12/02/2023   TRIG 65.0 12/02/2023   CHOLHDL 3 12/02/2023

## 2023-12-05 NOTE — Assessment & Plan Note (Signed)

## 2023-12-09 NOTE — Telephone Encounter (Signed)
 Labs have been ordered

## 2023-12-16 ENCOUNTER — Ambulatory Visit: Admitting: Internal Medicine

## 2023-12-31 ENCOUNTER — Other Ambulatory Visit: Payer: Self-pay | Admitting: Internal Medicine

## 2023-12-31 MED ORDER — AMLODIPINE BESYLATE 5 MG PO TABS: 5.0000 mg | ORAL_TABLET | Freq: Every day | ORAL | 3 refills | Status: AC

## 2024-04-15 ENCOUNTER — Ambulatory Visit: Admitting: Dermatology

## 2024-06-08 ENCOUNTER — Other Ambulatory Visit

## 2024-06-10 ENCOUNTER — Ambulatory Visit: Admitting: Internal Medicine
